# Patient Record
Sex: Female | Born: 1950 | Race: Black or African American | Hispanic: No | Marital: Single | Smoking: Never smoker
Health system: Southern US, Community
[De-identification: ages and names within clinical notes are randomized; demographics above are authoritative.]

## PROBLEM LIST (undated history)

## (undated) DIAGNOSIS — K219 Gastro-esophageal reflux disease without esophagitis: Secondary | ICD-10-CM

## (undated) DIAGNOSIS — M1990 Unspecified osteoarthritis, unspecified site: Secondary | ICD-10-CM

## (undated) HISTORY — PX: NO PAST SURGERIES: SHX2092

---

## 2001-07-13 ENCOUNTER — Encounter: Payer: Self-pay | Admitting: Unknown Physician Specialty

## 2001-07-13 ENCOUNTER — Ambulatory Visit (HOSPITAL_COMMUNITY): Admission: RE | Admit: 2001-07-13 | Discharge: 2001-07-13 | Payer: Self-pay | Admitting: Unknown Physician Specialty

## 2013-05-10 ENCOUNTER — Encounter (HOSPITAL_COMMUNITY): Payer: Self-pay | Admitting: Pharmacy Technician

## 2013-05-13 ENCOUNTER — Other Ambulatory Visit: Payer: Self-pay | Admitting: Orthopedic Surgery

## 2013-05-14 ENCOUNTER — Ambulatory Visit (HOSPITAL_COMMUNITY)
Admission: RE | Admit: 2013-05-14 | Discharge: 2013-05-14 | Disposition: A | Discharge status: Home / Self Care | Payer: BC Managed Care – PPO | Source: Ambulatory Visit | Attending: Orthopedic Surgery | Admitting: Orthopedic Surgery

## 2013-05-14 ENCOUNTER — Encounter (HOSPITAL_COMMUNITY)
Admission: RE | Admit: 2013-05-14 | Discharge: 2013-05-14 | Disposition: A | Discharge status: Home / Self Care | Payer: BC Managed Care – PPO | Source: Ambulatory Visit | Attending: Orthopedic Surgery | Admitting: Orthopedic Surgery

## 2013-05-14 ENCOUNTER — Encounter (HOSPITAL_COMMUNITY): Payer: Self-pay

## 2013-05-14 DIAGNOSIS — Z01818 Encounter for other preprocedural examination: Secondary | ICD-10-CM | POA: Insufficient documentation

## 2013-05-14 DIAGNOSIS — Z01810 Encounter for preprocedural cardiovascular examination: Secondary | ICD-10-CM | POA: Insufficient documentation

## 2013-05-14 DIAGNOSIS — Z01812 Encounter for preprocedural laboratory examination: Secondary | ICD-10-CM | POA: Insufficient documentation

## 2013-05-14 HISTORY — DX: Gastro-esophageal reflux disease without esophagitis: K21.9

## 2013-05-14 HISTORY — DX: Unspecified osteoarthritis, unspecified site: M19.90

## 2013-05-14 LAB — URINALYSIS, ROUTINE W REFLEX MICROSCOPIC
Leukocytes, UA: NEGATIVE
Nitrite: NEGATIVE
Specific Gravity, Urine: 1.026 (ref 1.005–1.030)
Urobilinogen, UA: 1 mg/dL (ref 0.0–1.0)

## 2013-05-14 LAB — CBC
Hemoglobin: 11.8 g/dL — ABNORMAL LOW (ref 12.0–15.0)
MCHC: 31.6 g/dL (ref 30.0–36.0)

## 2013-05-14 LAB — ABO/RH: ABO/RH(D): B POS

## 2013-05-14 LAB — SURGICAL PCR SCREEN
MRSA, PCR: NEGATIVE
Staphylococcus aureus: NEGATIVE

## 2013-05-14 LAB — COMPREHENSIVE METABOLIC PANEL
ALT: 14 U/L (ref 0–35)
Albumin: 3.6 g/dL (ref 3.5–5.2)
Alkaline Phosphatase: 92 U/L (ref 39–117)
GFR calc Af Amer: >90 mL/min (ref >90)
Glucose, Bld: 108 mg/dL — ABNORMAL HIGH (ref 70–99)
Potassium: 4 mEq/L (ref 3.5–5.1)
Sodium: 140 mEq/L (ref 135–145)
Total Protein: 7.5 g/dL (ref 6.0–8.3)

## 2013-05-14 LAB — TYPE AND SCREEN: Antibody Screen: NEGATIVE

## 2013-05-14 NOTE — Pre-Procedure Instructions (Signed)
[name]  05/14/2013   Your procedure is scheduled on: Wednesday, May 22, 2013  Report to [facility] at 10:30 AM.  Call this number if you have problems the morning of surgery: [phone]   Remember:   Do not eat food or drink liquids after midnight.   Take these medicines the morning of surgery with A SIP OF WATER: omeprazole (PRILOSEC) 20 MG capsule  if needed:              traMADol (ULTRAM) 50 MG tablet              Stop taking Aspirin, and herbal medications. Do not take any NSAIDs ie: Ibuprofen, Advil, Naproxen or any medication containing Aspirin (meloxicam (MOBIC) 7.5 MG tablet)             Do not wear jewelry, make-up or nail polish.  Do not wear lotions, powders, or perfumes. You may wear deodorant.  Do not shave 48 hours prior to surgery.   Do not bring valuables to the hospital.  [facility] is not responsible  for any belongings or valuables.  Contacts, dentures or bridgework may not be worn into surgery.  Leave suitcase in the car. After surgery it may be brought to your room.  For patients admitted to the hospital, checkout time is 11:00 AM the day of discharge.   Patients discharged the day of surgery will not be allowed to drive home.  Name and phone number of your driver:   Special Instructions: Shower using CHG 2 nights before surgery and the night before surgery.  If you shower the day of surgery use CHG.  Use special wash - you have one bottle of CHG for all showers.  You should use approximately 1/3 of the bottle for each shower.   Please read over the following fact sheets that you were given: Pain Booklet, Coughing and Deep Breathing, Blood Transfusion Information, MRSA Information and Surgical Site Infection Prevention

## 2013-05-14 NOTE — Progress Notes (Signed)
Dr Lysbeth Galas   In [place].  pcp

## 2013-05-15 NOTE — Progress Notes (Signed)
Anesthesia chart review: Patient is a 62 year old female scheduled for right THA by Dr. Eulah Pont on 05/22/2013. History includes nonsmoker, arthritis, GERD, morbid obesity (BMI 40). PCP is listed as Dr. Joette Catching.  EKG on 05/14/13 showed NSR, poor r wave progression. There are no comparison EKGs in [place], Epic, or at her PCP office.  No CV symptoms were documented at her PAT visit.  She has no known MI/CHF or DM history.  Body habitus and lead placement may be contributing.     CXR on 05/14/13 showed no edema or consolidation.  Preoperative labs noted.  PTT on the day of surgery.  Clinical correlation on the day of surgery.  If she remains asymptomatic from a CV standpoint then I would anticipate that she could proceed as planned.  Velna [facility]chs [facility]/Anesthesiology Phone [phone] 05/15/2013 4:58 PM

## 2013-05-21 MED ORDER — CEFAZOLIN SODIUM-DEXTROSE 2-3 GM-% IV SOLR
2.0000 g | INTRAVENOUS | Status: AC
Start: 2013-05-22 — End: 2013-05-22
  Administered 2013-05-22: 2 g via INTRAVENOUS
  Filled 2013-05-21: qty 50

## 2013-05-21 MED ORDER — TRANEXAMIC ACID 100 MG/ML IV SOLN
1000.0000 mg | INTRAVENOUS | Status: DC
  Filled 2013-05-21: qty 10

## 2013-05-21 NOTE — Progress Notes (Signed)
Pt called with tx change inst to arrive at 0930.message lft on machine.

## 2013-05-21 NOTE — H&P (Signed)
[facility] [address]   [address] [place], [place] [address] [phone] A Division of [facility]  Loreta Ave, M.D.   Robert A. Thurston Hole, M.D.   Burnell Blanks, M.D.   Eulas Post, M.D.   Lunette Stands, M.D Buford Dresser, M.D.  Charlsie Quest, M.D.   Estell Harpin, M.D.   Melina Fiddler, M.D. Genene Churn. Barry Dienes, PA-C            Kirstin A. Shepperson, PA-C Josh Sardis, PA-C [place], [place]   RE: [name], [name]                                [id number]      DOB: 03/16/1951 PROGRESS NOTE: 05-07-13 Chief complaint: Right hip pain.  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  Her symptoms are unchanged from the previous visit and she wishes to proceed with a total hip arthroplasty as scheduled.  She has pain with all activity, particularly rising from a chair.  She has failed conservative treatment, including physical therapy, nonsteroidal anti-inflammatories and intraarticular injection.  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   Current medications: Omeprazole 20 mg q day, Meloxicam 7.5 mg q day and Tramadol 50 mg q day.  She will discontinue the Meloxicam one week prior to the surgery.   Allergies: Shellfish.  No drug allergies. Past medical/surgical history: Significant for slight gastric reflux.  No surgical history.  Two hospitalizations for childbirth, uncomplicated.  Review of systems: Positive for a history of nausea and vomiting when sick with flu like illness, but otherwise unremarkable.  She denies any lightheadedness, dizziness, fevers or chills.  No current cardiac, pulmonary, GI, GU or neuro issues. Family history: Positive for hypertension and diabetes in her father and diabetes in a brother and sister, as well as one child.     Social history: Does not smoke, very occasional glass of wine.  Patient is married.       EXAMINATION: Height: 5?5.  Weight: 247 pounds.  Temperature: 98.9 today in the office.  Blood pressure: 177/92.  Pulse: 95.  O2 SAT: 96%.  She is alert and oriented x 3 and in noa cute distress.  She has a slightly rolling gait.  Head is normocephalic, a traumatic.  PERRLA, EOMI.  Neck is unremarkable, full range of motion.  Lungs: CTA bilaterally.  No wheezes.  Heart: RRR.  No murmurs or rubs.  Abdomen: Soft and non-tender.  NBS x 4.  Right hip has decreased range of motion with loss of almost all rotation to log roll testing.  Left hip has no such difficulties with log roll.  Her pain is significant for both groin and for thigh pain.  Neurovascularly intact distally.  Skin warm and dry.    X-RAYS: X-rays of the hip show no joint space remaining on the left and significant bone destruction in the acetabulum and femoral head.   IMPRESSION: Right hip end stage degenerative joint disease with chronic pain that has failed conservative treatment.  PLAN: Patient will proceed with right total hip replacement as scheduled.  The risks and benefits were discussed.  Possible complications in rehab and recovery.  All questions answered to the patient's satisfaction.  Patient  will need skilled placement and has decided that she prefers to go to [facility] in [place] if at all possible.  Patient will return to see Dr. Eulah Pont postoperatively.    Loreta Ave, M.D.   Electronically verified by Loreta Ave, M.D. DFM(BR):jjh D 05-17-13 T 05-20-13

## 2013-05-22 ENCOUNTER — Inpatient Hospital Stay (HOSPITAL_COMMUNITY): Payer: BC Managed Care – PPO

## 2013-05-22 ENCOUNTER — Inpatient Hospital Stay (HOSPITAL_COMMUNITY): Payer: BC Managed Care – PPO | Admitting: Anesthesiology

## 2013-05-22 ENCOUNTER — Encounter (HOSPITAL_COMMUNITY)
Admission: RE | Disposition: A | Discharge status: Home / Self Care | Payer: Self-pay | Source: Ambulatory Visit | Attending: Orthopedic Surgery

## 2013-05-22 ENCOUNTER — Encounter (HOSPITAL_COMMUNITY): Payer: Self-pay | Admitting: *Deleted

## 2013-05-22 ENCOUNTER — Encounter (HOSPITAL_COMMUNITY): Payer: Self-pay | Admitting: Vascular Surgery

## 2013-05-22 ENCOUNTER — Inpatient Hospital Stay (HOSPITAL_COMMUNITY)
Admission: RE | Admit: 2013-05-22 | Discharge: 2013-05-27 | DRG: 818 | Disposition: A | Discharge status: Home / Self Care | Payer: BC Managed Care – PPO | Source: Ambulatory Visit | Attending: Orthopedic Surgery | Admitting: Orthopedic Surgery

## 2013-05-22 DIAGNOSIS — M1610 Unilateral primary osteoarthritis, unspecified hip: Principal | ICD-10-CM | POA: Diagnosis present

## 2013-05-22 DIAGNOSIS — M169 Osteoarthritis of hip, unspecified: Principal | ICD-10-CM | POA: Diagnosis present

## 2013-05-22 DIAGNOSIS — IMO0002 Reserved for concepts with insufficient information to code with codable children: Secondary | ICD-10-CM | POA: Diagnosis present

## 2013-05-22 DIAGNOSIS — L299 Pruritus, unspecified: Secondary | ICD-10-CM | POA: Diagnosis present

## 2013-05-22 DIAGNOSIS — D62 Acute posthemorrhagic anemia: Secondary | ICD-10-CM | POA: Diagnosis not present

## 2013-05-22 DIAGNOSIS — D500 Iron deficiency anemia secondary to blood loss (chronic): Secondary | ICD-10-CM | POA: Diagnosis present

## 2013-05-22 DIAGNOSIS — Z8249 Family history of ischemic heart disease and other diseases of the circulatory system: Secondary | ICD-10-CM

## 2013-05-22 DIAGNOSIS — M1710 Unilateral primary osteoarthritis, unspecified knee: Secondary | ICD-10-CM | POA: Diagnosis present

## 2013-05-22 DIAGNOSIS — M1611 Unilateral primary osteoarthritis, right hip: Secondary | ICD-10-CM

## 2013-05-22 DIAGNOSIS — Z833 Family history of diabetes mellitus: Secondary | ICD-10-CM

## 2013-05-22 DIAGNOSIS — K219 Gastro-esophageal reflux disease without esophagitis: Secondary | ICD-10-CM | POA: Diagnosis present

## 2013-05-22 HISTORY — PX: TOTAL HIP ARTHROPLASTY: SHX124

## 2013-05-22 LAB — APTT: aPTT: 30 seconds (ref 24–37)

## 2013-05-22 LAB — CREATININE, SERUM: Creatinine, Ser: 0.56 mg/dL (ref 0.50–1.10)

## 2013-05-22 LAB — CBC
MCH: 26.4 pg (ref 26.0–34.0)
MCHC: 31.7 g/dL (ref 30.0–36.0)
Platelets: 246 10*3/uL (ref 150–400)
RBC: 4.2 MIL/uL (ref 3.87–5.11)

## 2013-05-22 SURGERY — ARTHROPLASTY, HIP, TOTAL,POSTERIOR APPROACH
Anesthesia: General | Site: Hip | Laterality: Right | Wound class: Clean

## 2013-05-22 MED ORDER — ACETAMINOPHEN 650 MG RE SUPP: 650.0000 mg | Freq: Four times a day (QID) | RECTAL | Status: DC | PRN

## 2013-05-22 MED ORDER — SODIUM CHLORIDE 0.9 % IJ SOLN
INTRAMUSCULAR | Status: DC | PRN
  Administered 2013-05-22: 13:00:00

## 2013-05-22 MED ORDER — PANTOPRAZOLE SODIUM 40 MG PO TBEC
40.0000 mg | DELAYED_RELEASE_TABLET | Freq: Every day | ORAL | Status: DC
  Administered 2013-05-22 – 2013-05-27 (×6): 40 mg via ORAL
  Filled 2013-05-22 (×6): qty 1

## 2013-05-22 MED ORDER — PHENYLEPHRINE HCL 10 MG/ML IJ SOLN
INTRAMUSCULAR | Status: DC | PRN
  Administered 2013-05-22 (×4): 80 ug via INTRAVENOUS

## 2013-05-22 MED ORDER — HYDROMORPHONE HCL PF 1 MG/ML IJ SOLN
INTRAMUSCULAR | Status: AC
  Filled 2013-05-22: qty 1

## 2013-05-22 MED ORDER — SODIUM CHLORIDE 0.9 % IR SOLN
Status: DC | PRN
  Administered 2013-05-22: 1000 mL

## 2013-05-22 MED ORDER — ONDANSETRON HCL 4 MG/2ML IJ SOLN: 4.0000 mg | Freq: Once | INTRAMUSCULAR | Status: DC | PRN

## 2013-05-22 MED ORDER — PROPOFOL 10 MG/ML IV BOLUS
INTRAVENOUS | Status: DC | PRN
  Administered 2013-05-22: 200 mg via INTRAVENOUS

## 2013-05-22 MED ORDER — GLYCOPYRROLATE 0.2 MG/ML IJ SOLN
INTRAMUSCULAR | Status: DC | PRN
  Administered 2013-05-22: 0.4 mg via INTRAVENOUS

## 2013-05-22 MED ORDER — METOCLOPRAMIDE HCL 5 MG/ML IJ SOLN: 5.0000 mg | Freq: Three times a day (TID) | INTRAMUSCULAR | Status: DC | PRN

## 2013-05-22 MED ORDER — POTASSIUM CHLORIDE IN NACL 20-0.9 MEQ/L-% IV SOLN
INTRAVENOUS | Status: DC
  Administered 2013-05-22 – 2013-05-23 (×3): via INTRAVENOUS
  Filled 2013-05-22 (×10): qty 1000

## 2013-05-22 MED ORDER — ONDANSETRON HCL 4 MG/2ML IJ SOLN: 4.0000 mg | Freq: Four times a day (QID) | INTRAMUSCULAR | Status: DC | PRN

## 2013-05-22 MED ORDER — LACTATED RINGERS IV SOLN
INTRAVENOUS | Status: DC
  Administered 2013-05-22 (×2): via INTRAVENOUS

## 2013-05-22 MED ORDER — NEOSTIGMINE METHYLSULFATE 1 MG/ML IJ SOLN
INTRAMUSCULAR | Status: DC | PRN
  Administered 2013-05-22: 3 mg via INTRAVENOUS

## 2013-05-22 MED ORDER — BUPIVACAINE HCL (PF) 0.25 % IJ SOLN
INTRAMUSCULAR | Status: AC
  Filled 2013-05-22: qty 30

## 2013-05-22 MED ORDER — BUPIVACAINE-EPINEPHRINE PF 0.25-1:200000 % IJ SOLN
INTRAMUSCULAR | Status: DC | PRN
  Administered 2013-05-22: 20 mL

## 2013-05-22 MED ORDER — LIDOCAINE HCL 4 % MT SOLN
OROMUCOSAL | Status: DC | PRN
  Administered 2013-05-22: 4 mL via TOPICAL

## 2013-05-22 MED ORDER — ENOXAPARIN SODIUM 40 MG/0.4ML ~~LOC~~ SOLN
40.0000 mg | SUBCUTANEOUS | Status: DC
  Administered 2013-05-23 – 2013-05-27 (×5): 40 mg via SUBCUTANEOUS
  Filled 2013-05-22 (×7): qty 0.4

## 2013-05-22 MED ORDER — ONDANSETRON HCL 4 MG/2ML IJ SOLN
INTRAMUSCULAR | Status: DC | PRN
  Administered 2013-05-22: 4 mg via INTRAVENOUS

## 2013-05-22 MED ORDER — METOCLOPRAMIDE HCL 10 MG PO TABS: 5.0000 mg | ORAL_TABLET | Freq: Three times a day (TID) | ORAL | Status: DC | PRN

## 2013-05-22 MED ORDER — HYDROMORPHONE HCL PF 1 MG/ML IJ SOLN
1.0000 mg | INTRAMUSCULAR | Status: DC | PRN
  Administered 2013-05-22: 1 mg via INTRAVENOUS
  Filled 2013-05-22: qty 1

## 2013-05-22 MED ORDER — ACETAMINOPHEN 325 MG PO TABS: 650.0000 mg | ORAL_TABLET | Freq: Four times a day (QID) | ORAL | Status: DC | PRN

## 2013-05-22 MED ORDER — BUPIVACAINE LIPOSOME 1.3 % IJ SUSP
20.0000 mL | INTRAMUSCULAR | Status: DC
  Filled 2013-05-22: qty 20

## 2013-05-22 MED ORDER — OXYCODONE-ACETAMINOPHEN 5-325 MG PO TABS
1.0000 | ORAL_TABLET | ORAL | Status: DC | PRN
  Administered 2013-05-22 – 2013-05-25 (×10): 2 via ORAL
  Filled 2013-05-22 (×9): qty 2

## 2013-05-22 MED ORDER — SENNOSIDES-DOCUSATE SODIUM 8.6-50 MG PO TABS: 1.0000 | ORAL_TABLET | Freq: Every evening | ORAL | Status: DC | PRN

## 2013-05-22 MED ORDER — ONDANSETRON HCL 4 MG PO TABS: 4.0000 mg | ORAL_TABLET | Freq: Four times a day (QID) | ORAL | Status: DC | PRN

## 2013-05-22 MED ORDER — HYDROCODONE-ACETAMINOPHEN 5-325 MG PO TABS
1.0000 | ORAL_TABLET | ORAL | Status: DC | PRN
  Administered 2013-05-23 – 2013-05-27 (×4): 2 via ORAL
  Filled 2013-05-22 (×4): qty 2

## 2013-05-22 MED ORDER — ROCURONIUM BROMIDE 100 MG/10ML IV SOLN
INTRAVENOUS | Status: DC | PRN
  Administered 2013-05-22: 30 mg via INTRAVENOUS

## 2013-05-22 MED ORDER — FLEET ENEMA 7-19 GM/118ML RE ENEM: 1.0000 | ENEMA | Freq: Once | RECTAL | Status: AC | PRN

## 2013-05-22 MED ORDER — CELECOXIB 200 MG PO CAPS
200.0000 mg | ORAL_CAPSULE | Freq: Two times a day (BID) | ORAL | Status: DC
  Administered 2013-05-22 – 2013-05-27 (×10): 200 mg via ORAL
  Filled 2013-05-22 (×11): qty 1

## 2013-05-22 MED ORDER — CHLORHEXIDINE GLUCONATE 4 % EX LIQD: 60.0000 mL | Freq: Once | CUTANEOUS | Status: DC

## 2013-05-22 MED ORDER — ENOXAPARIN SODIUM 40 MG/0.4ML ~~LOC~~ SOLN: 40.0000 mg | SUBCUTANEOUS | Status: DC

## 2013-05-22 MED ORDER — FENTANYL CITRATE 0.05 MG/ML IJ SOLN
INTRAMUSCULAR | Status: DC | PRN
  Administered 2013-05-22 (×3): 50 ug via INTRAVENOUS
  Administered 2013-05-22: 100 ug via INTRAVENOUS

## 2013-05-22 MED ORDER — MENTHOL 3 MG MT LOZG: 1.0000 | LOZENGE | OROMUCOSAL | Status: DC | PRN

## 2013-05-22 MED ORDER — LIDOCAINE HCL (CARDIAC) 20 MG/ML IV SOLN
INTRAVENOUS | Status: DC | PRN
  Administered 2013-05-22: 70 mg via INTRAVENOUS

## 2013-05-22 MED ORDER — BISACODYL 10 MG RE SUPP: 10.0000 mg | Freq: Every day | RECTAL | Status: DC | PRN

## 2013-05-22 MED ORDER — OXYCODONE-ACETAMINOPHEN 5-325 MG PO TABS
ORAL_TABLET | ORAL | Status: AC
  Filled 2013-05-22: qty 2

## 2013-05-22 MED ORDER — DOCUSATE SODIUM 100 MG PO CAPS
100.0000 mg | ORAL_CAPSULE | Freq: Two times a day (BID) | ORAL | Status: DC
  Administered 2013-05-22 – 2013-05-27 (×10): 100 mg via ORAL
  Filled 2013-05-22 (×11): qty 1

## 2013-05-22 MED ORDER — CEFAZOLIN SODIUM-DEXTROSE 2-3 GM-% IV SOLR
2.0000 g | Freq: Four times a day (QID) | INTRAVENOUS | Status: AC
  Administered 2013-05-22 – 2013-05-23 (×2): 2 g via INTRAVENOUS
  Filled 2013-05-22 (×2): qty 50

## 2013-05-22 MED ORDER — ALUM & MAG HYDROXIDE-SIMETH 200-200-20 MG/5ML PO SUSP: 30.0000 mL | ORAL | Status: DC | PRN

## 2013-05-22 MED ORDER — HYDROMORPHONE HCL PF 1 MG/ML IJ SOLN
0.2500 mg | INTRAMUSCULAR | Status: DC | PRN
  Administered 2013-05-22 (×6): 0.5 mg via INTRAVENOUS

## 2013-05-22 MED ORDER — PHENOL 1.4 % MT LIQD: 1.0000 | OROMUCOSAL | Status: DC | PRN

## 2013-05-22 SURGICAL SUPPLY — 63 items
BLADE SAW SAG 73X25 THK (BLADE) ×1
BLADE SAW SGTL 73X25 THK (BLADE) ×1 IMPLANT
BOOTCOVER CLEANROOM LRG (PROTECTIVE WEAR) ×2 IMPLANT
BRUSH FEMORAL CANAL (MISCELLANEOUS) IMPLANT
CLOTH BEACON ORANGE TIMEOUT ST (SAFETY) ×2 IMPLANT
COVER BACK TABLE 24X17X13 BIG (DRAPES) IMPLANT
COVER SURGICAL LIGHT HANDLE (MISCELLANEOUS) ×2 IMPLANT
DRAPE INCISE IOBAN 66X45 STRL (DRAPES) ×1 IMPLANT
DRAPE ORTHO SPLIT 77X108 STRL (DRAPES) ×4
DRAPE SURG ORHT 6 SPLT 77X108 (DRAPES) ×2 IMPLANT
DRAPE U-SHAPE 47X51 STRL (DRAPES) ×2 IMPLANT
DRILL BIT 7/64X5 (BIT) ×2 IMPLANT
DRSG PAD ABDOMINAL 8X10 ST (GAUZE/BANDAGES/DRESSINGS) ×4 IMPLANT
DURAPREP 26ML APPLICATOR (WOUND CARE) ×2 IMPLANT
ELECT BLADE 6.5 EXT (BLADE) IMPLANT
ELECT CAUTERY BLADE 6.4 (BLADE) ×2 IMPLANT
ELECT REM PT RETURN 9FT ADLT (ELECTROSURGICAL) ×2
ELECTRODE REM PT RTRN 9FT ADLT (ELECTROSURGICAL) ×1 IMPLANT
EVACUATOR 1/8 PVC DRAIN (DRAIN) IMPLANT
FACESHIELD LNG OPTICON STERILE (SAFETY) ×4 IMPLANT
GAUZE XEROFORM 5X9 LF (GAUZE/BANDAGES/DRESSINGS) ×2 IMPLANT
GLOVE BIOGEL PI IND STRL 8 (GLOVE) ×1 IMPLANT
GLOVE BIOGEL PI INDICATOR 8 (GLOVE) ×1
GLOVE ORTHO TXT STRL SZ7.5 (GLOVE) ×4 IMPLANT
GOWN PREVENTION PLUS XLARGE (GOWN DISPOSABLE) ×4 IMPLANT
GOWN STRL NON-REIN LRG LVL3 (GOWN DISPOSABLE) ×2 IMPLANT
GOWN STRL REIN 2XL XLG LVL4 (GOWN DISPOSABLE) ×2 IMPLANT
HANDPIECE INTERPULSE COAX TIP (DISPOSABLE)
HIP/CERM HD VIT E LINR LEV 1C ×1 IMPLANT
KIT BASIN OR (CUSTOM PROCEDURE TRAY) ×2 IMPLANT
KIT ROOM TURNOVER OR (KITS) ×2 IMPLANT
MANIFOLD NEPTUNE II (INSTRUMENTS) ×2 IMPLANT
NDL HYPO 25GX1X1/2 BEV (NEEDLE) ×1 IMPLANT
NEEDLE HYPO 25GX1X1/2 BEV (NEEDLE) IMPLANT
NS IRRIG 1000ML POUR BTL (IV SOLUTION) ×2 IMPLANT
PACK TOTAL JOINT (CUSTOM PROCEDURE TRAY) ×2 IMPLANT
PAD ARMBOARD 7.5X6 YLW CONV (MISCELLANEOUS) ×4 IMPLANT
PASSER SUT SWANSON 36MM LOOP (INSTRUMENTS) ×2 IMPLANT
PILLOW ABDUCTION HIP (SOFTGOODS) ×2 IMPLANT
PRESSURIZER FEMORAL UNIV (MISCELLANEOUS) IMPLANT
SET HNDPC FAN SPRY TIP SCT (DISPOSABLE) IMPLANT
SPONGE GAUZE 4X4 12PLY (GAUZE/BANDAGES/DRESSINGS) ×2 IMPLANT
SPONGE LAP 4X18 X RAY DECT (DISPOSABLE) ×1 IMPLANT
STAPLER VISISTAT 35W (STAPLE) ×2 IMPLANT
SUCTION FRAZIER TIP 10 FR DISP (SUCTIONS) ×2 IMPLANT
SUT FIBERWIRE #2 38 REV NDL BL (SUTURE) ×4
SUT VIC AB 1 CTX 36 (SUTURE) ×2
SUT VIC AB 1 CTX36XBRD ANBCTR (SUTURE) ×4 IMPLANT
SUT VIC AB 2-0 CT1 27 (SUTURE) ×2
SUT VIC AB 2-0 CT1 TAPERPNT 27 (SUTURE) IMPLANT
SUT VIC AB 2-0 SH 27 (SUTURE) ×2
SUT VIC AB 2-0 SH 27XBRD (SUTURE) ×2 IMPLANT
SUT VIC AB 3-0 SH 27 (SUTURE)
SUT VIC AB 3-0 SH 27X BRD (SUTURE) IMPLANT
SUTURE FIBERWR#2 38 REV NDL BL (SUTURE) ×3 IMPLANT
SYR 30ML SLIP (SYRINGE) ×2 IMPLANT
SYR CONTROL 10ML LL (SYRINGE) ×2 IMPLANT
TAPE CLOTH SURG 6X10 WHT LF (GAUZE/BANDAGES/DRESSINGS) ×1 IMPLANT
TOWEL OR 17X24 6PK STRL BLUE (TOWEL DISPOSABLE) ×2 IMPLANT
TOWEL OR 17X26 10 PK STRL BLUE (TOWEL DISPOSABLE) ×2 IMPLANT
TOWER CARTRIDGE SMART MIX (DISPOSABLE) IMPLANT
TRAY FOLEY CATH 14FR (SET/KITS/TRAYS/PACK) ×2 IMPLANT
WATER STERILE IRR 1000ML POUR (IV SOLUTION) ×6 IMPLANT

## 2013-05-22 NOTE — Transfer of Care (Signed)
Immediate Anesthesia Transfer of Care Note  Patient: [name]  Procedure(s) Performed: Procedure(s): TOTAL HIP ARTHROPLASTY- right  (Right)  Patient Location: PACU  Anesthesia Type:General  Level of Consciousness: awake, alert  and oriented  Airway & Oxygen Therapy: Patient Spontanous Breathing and Patient connected to nasal cannula oxygen  Post-op Assessment: Report given to PACU RN, Post -op Vital signs reviewed and stable and Patient moving all extremities X 4  Post vital signs: Reviewed and stable  Complications: No apparent anesthesia complications

## 2013-05-22 NOTE — Anesthesia Preprocedure Evaluation (Addendum)
Anesthesia Evaluation  Patient identified by MRN, date of birth, ID band Patient awake    Reviewed: Allergy & Precautions, H&P , NPO status , Patient's Chart, lab work & pertinent test results  Airway Mallampati: I TM Distance: >3 FB Neck ROM: full    Dental  (+) Teeth Intact   Pulmonary          Cardiovascular Rhythm:regular Rate:Normal     Neuro/Psych    GI/Hepatic GERD-  ,  Endo/Other    Renal/GU      Musculoskeletal   Abdominal   Peds  Hematology   Anesthesia Other Findings   Reproductive/Obstetrics                          Anesthesia Physical Anesthesia Plan  ASA: I  Anesthesia Plan: General   Post-op Pain Management:    Induction: Intravenous  Airway Management Planned: Oral ETT  Additional Equipment:   Intra-op Plan:   Post-operative Plan: Extubation in OR  Informed Consent: I have reviewed the patients History and Physical, chart, labs and discussed the procedure including the risks, benefits and alternatives for the proposed anesthesia with the patient or authorized representative who has indicated his/her understanding and acceptance.     Plan Discussed with: CRNA, Anesthesiologist and Surgeon  Anesthesia Plan Comments:         Anesthesia Quick Evaluation

## 2013-05-22 NOTE — Interval H&P Note (Signed)
History and Physical Interval Note:  05/22/2013 8:17 AM  [name]  has presented today for surgery, with the diagnosis of DJD RIGHT HIP  The various methods of treatment have been discussed with the patient and family. After consideration of risks, benefits and other options for treatment, the patient has consented to  Procedure(s): TOTAL HIP ARTHROPLASTY (Right) as a surgical intervention .  The patient's history has been reviewed, patient examined, no change in status, stable for surgery.  I have reviewed the patient's chart and labs.  Questions were answered to the patient's satisfaction.     Nareg Breighner F

## 2013-05-22 NOTE — Anesthesia Procedure Notes (Signed)
Procedure Name: Intubation Date/Time: 05/22/2013 11:54 AM Performed by: Quentin Ore Pre-anesthesia Checklist: Patient identified, Emergency Drugs available, Suction available, Patient being monitored and Timeout performed Patient Re-evaluated:Patient Re-evaluated prior to inductionOxygen Delivery Method: Circle system utilized Preoxygenation: Pre-oxygenation with 100% oxygen Intubation Type: IV induction Ventilation: Mask ventilation without difficulty Laryngoscope Size: Mac and 3 Grade View: Grade II Tube type: Oral Tube size: 7.0 mm Number of attempts: 1 Airway Equipment and Method: Stylet and LTA kit utilized Placement Confirmation: ETT inserted through vocal cords under direct vision,  positive ETCO2 and breath sounds checked- equal and bilateral Secured at: 21 cm Tube secured with: Tape Dental Injury: Teeth and Oropharynx as per pre-operative assessment

## 2013-05-22 NOTE — Anesthesia Postprocedure Evaluation (Signed)
  Anesthesia Post-op Note  Patient: [name]  Procedure(s) Performed: Procedure(s): TOTAL HIP ARTHROPLASTY- right  (Right)  Patient Location: PACU  Anesthesia Type:General  Level of Consciousness: awake  Airway and Oxygen Therapy: Patient Spontanous Breathing  Post-op Pain: mild  Post-op Assessment: Post-op Vital signs reviewed  Post-op Vital Signs: stable  Complications: No apparent anesthesia complications

## 2013-05-22 NOTE — Preoperative (Signed)
Beta Blockers   Reason not to administer Beta Blockers:Not Applicable 

## 2013-05-23 ENCOUNTER — Encounter (HOSPITAL_COMMUNITY): Payer: Self-pay | Admitting: Orthopedic Surgery

## 2013-05-23 LAB — BASIC METABOLIC PANEL
Calcium: 8.4 mg/dL (ref 8.4–10.5)
Creatinine, Ser: 0.72 mg/dL (ref 0.50–1.10)
GFR calc Af Amer: >90 mL/min (ref >90)

## 2013-05-23 NOTE — Clinical Social Work Placement (Addendum)
Clinical Social Work Department  CLINICAL SOCIAL WORK PLACEMENT NOTE  05/23/2013  Patient: LILIE VEZINA Account Number: [id number]  Admit date: 05/22/13  Clinical Social Worker: Sabino Niemann MSW Date/time: 05/23/2013 1:00 PM  Clinical Social Work is seeking post-discharge placement for this patient at the following level of care: SKILLED NURSING (*CSW will update this form in Epic as items are completed)  05/23/2013 Patient/family provided with [facility] Department of Clinical Social Work's list of facilities offering this level of care within the geographic area requested by the patient (or if unable, by the patient's family).  05/23/2013 Patient/family informed of their freedom to choose among providers that offer the needed level of care, that participate in Medicare, Medicaid or managed care program needed by the patient, have an available bed and are willing to accept the patient.  6/19/2014Patient/family informed of [facility]' ownership interest in [facility], as well as of the fact that they are under no obligation to receive care at this facility.  PASARR submitted to EDS on  PASARR number received from EDS on  FL2 transmitted to all facilities in geographic area requested by pt/family on 05/23/2013  FL2 transmitted to all facilities within larger geographic area on  Patient informed that his/her managed care company has contracts with or will negotiate with certain facilities, including the following:  Patient/family informed of bed offers received:  Patient chooses bed at [facility] in [place]  Physician recommends and patient chooses bed at  Patient to be transferred to on  Patient to be transferred to facility by  The following physician request were entered in Epic:  Additional Comments:

## 2013-05-23 NOTE — Clinical Social Work Psychosocial (Signed)
Clinical Social Work Department  BRIEF PSYCHOSOCIAL ASSESSMENT  Patient:Taleyah B Miers Account Number: [id number]  Admit date: 05/22/13 Clinical Social Worker Sabino Niemann, MSW Date/Time: 05/23/2013 1:30 PM Referred by: Physician Date Referred: 05/23/2013 Referred for   SNF Placement   Other Referral:  Interview type: Patient-Patient's family was present Other interview type: PSYCHOSOCIAL DATA  Living Status:Husband Admitted from facility:  Level of care:  Primary support name: Hypes,David Primary support relationship to patient: Spouse Degree of support available:  Strong and vested  CURRENT CONCERNS  Current Concerns   Post-Acute Placement   Other Concerns:  SOCIAL WORK ASSESSMENT / PLAN  CSW met with pt re: PT recommendation for SNF.   Pt lives with her husband  CSW explained placement process and answered questions.   Pt reports [facility] in [place] as her preference    CSW completed FL2 and initiated SNF search.     Assessment/plan status: Information/Referral to [facility]  Other assessment/ plan:  Information/referral to community resources:  SNF   PTAR  PATIENT'S/FAMILY'S RESPONSE TO PLAN OF CARE:  Pt  reports she is agreeable to ST SNF in order to increase strength and independence with mobility prior to returning home  Pt verbalized understanding of placement process and appreciation for CSW assist.   Sabino Niemann, MSW [phone]

## 2013-05-23 NOTE — Evaluation (Signed)
Occupational Therapy Evaluation Patient Details Name: [name] MRN: [id number] DOB: Oct 20, 1951 Today's Date: 05/23/2013 Time: 4098-1191 OT Time Calculation (min): 31 min  OT Assessment / Plan / Recommendation Clinical Impression  Pt is a 62 yr old female admitted for elective right THA.  Pt currently total assist +2 (pt 50%) for LB selfcare and functional transfers.  Feel pt will benefit from acute care OT to help increase overall independence with basic selfcare tasks but anticipate pt will need follow-up SNF rehab at discharge.    OT Assessment  Patient needs continued OT Services    Follow Up Recommendations  SNF    Barriers to Discharge Decreased caregiver support    Equipment Recommendations  Tub/shower bench       Frequency  Min 2X/week    Precautions / Restrictions Precautions Precautions: Posterior Hip;Fall Precaution Comments: pt educated on 3/3 hip precautions; at end of session pt able to recall 2/3 independently  Restrictions Weight Bearing Restrictions: No RLE Weight Bearing: Weight bearing as tolerated   Pertinent Vitals/Pain Pain reported at 5/10 in the right hip, ice applied and meds given earlier    ADL  Eating/Feeding: Independent Where Assessed - Eating/Feeding: Chair Grooming: Simulated;Set up Where Assessed - Grooming: Supported sitting Upper Body Bathing: Simulated;Set up Where Assessed - Upper Body Bathing: Supported sitting Lower Body Bathing: Simulated;+2 Total assistance Lower Body Bathing: Patient Percentage: 50% Where Assessed - Lower Body Bathing: Supported sit to stand Upper Body Dressing: Simulated;Set up Where Assessed - Upper Body Dressing: Unsupported sitting Lower Body Dressing: Simulated;+2 Total assistance Lower Body Dressing: Patient Percentage: 50% Where Assessed - Lower Body Dressing: Supported sit to stand Toilet Transfer: Performed;+2 Total assistance Toilet Transfer: Patient Percentage: 50% Statistician Method:  Surveyor, minerals: Materials engineer and Hygiene: Simulated;+2 Total assistance Toileting - Architect and Hygiene: Patient Percentage: 50% Where Assessed - Engineer, mining and Hygiene: Sit to stand from 3-in-1 or toilet Tub/Shower Transfer Method: Not assessed Equipment Used: Gait belt;Rolling walker Transfers/Ambulation Related to ADLs: Pt is able to take 5-6 small steps with total assist +2 (pt 50%) using the RW. ADL Comments: Pt is overall total assist +2 for LB selfcare tasks.  Will benefit from AE for LB selfcare.  Plans to discharge to SNF level rehab.      OT Diagnosis: Acute pain  OT Problem List: Decreased strength;Decreased activity tolerance;Impaired balance (sitting and/or standing);Pain;Decreased knowledge of precautions OT Treatment Interventions: Self-care/ADL training;DME and/or AE instruction;Therapeutic activities;Balance training;Patient/family education   OT Goals Acute Rehab OT Goals OT Goal Formulation: With patient/family Time For Goal Achievement: 05/30/13 Potential to Achieve Goals: Good ADL Goals Pt Will Perform Grooming: with min assist;Supported;Standing at sink ADL Goal: Grooming - Progress: Goal set today Pt Will Perform Lower Body Bathing: with min assist;Sit to stand from bed;Supported ADL Goal: Lower Body Bathing - Progress: Goal set today Pt Will Perform Lower Body Dressing: with min assist;with adaptive equipment;Sit to stand from bed ADL Goal: Lower Body Dressing - Progress: Goal set today Pt Will Transfer to Toilet: with min assist;Ambulation;3-in-1;with DME;Maintaining hip precautions ADL Goal: Toilet Transfer - Progress: Goal set today Pt Will Perform Toileting - Clothing Manipulation: with min assist ADL Goal: Toileting - Clothing Manipulation - Progress: Goal set today Pt Will Perform Toileting - Hygiene: with min assist;Sit to stand from 3-in-1/toilet ADL Goal:  Toileting - Hygiene - Progress: Goal set today Miscellaneous OT Goals Miscellaneous OT Goal #1: Pt will state and follow 3/3  THR precautions.  OT Goal: Miscellaneous Goal #1 - Progress: Goal set today  Visit Information  Last OT Received On: 05/23/13 Assistance Needed: +2    Subjective Data  Subjective: We're going to get up to the chair? Patient Stated Goal: Get back to moving better.   Prior Functioning     Home Living Lives With: Spouse;Other (Comment) Available Help at Discharge: Skilled Nursing Facility;Other (Comment) (Going to the [facility]) Type of Home: House Home Access: Stairs to enter [employer] of Steps: 6 Entrance Stairs-Rails: Right;Left Bathroom Shower/Tub: Forensic scientist: Standard Home Adaptive Equipment: Straight cane;Bedside commode/3-in-1;Hand-held shower hose Prior Function Level of Independence: Independent with assistive device(s) Able to Take Stairs?: Yes Driving: Yes Vocation: Other (comment) Comments: substitute teacher Dominant Hand: Right         Vision/Perception Vision - History Baseline Vision: No visual deficits Patient Visual Report: No change from baseline Vision - Assessment Vision Assessment: Vision not tested Perception Perception: Within Functional Limits Praxis Praxis: Intact   Cognition  Cognition Arousal/Alertness: Awake/alert Behavior During Therapy: WFL for tasks assessed/performed Overall Cognitive Status: Within Functional Limits for tasks assessed    Extremity/Trunk Assessment Right Upper Extremity Assessment RUE ROM/Strength/Tone: Within functional levels RUE Sensation: WFL - Light Touch RUE Coordination: WFL - gross/fine motor Left Upper Extremity Assessment LUE ROM/Strength/Tone: Within functional levels LUE Sensation: WFL - Light Touch LUE Coordination: WFL - gross/fine motor Right Lower Extremity Assessment RLE ROM/Strength/Tone: Deficits;Unable to fully  assess;Due to pain;Due to precautions RLE ROM/Strength/Tone Deficits: DF/PF WFL; unable to assess knee or hip due to pain  RLE Sensation: WFL - Light Touch Left Lower Extremity Assessment LLE ROM/Strength/Tone: WFL for tasks assessed LLE Sensation: WFL - Light Touch Trunk Assessment Trunk Assessment: Normal     Mobility Bed Mobility Bed Mobility: Supine to Sit Supine to Sit: 3: Mod assist;HOB elevated;With rails Details for Bed Mobility Assistance: Pt transitioned supine to sit with mod facilitation in the trunk to transition to sitting.   Transfers Transfers: Sit to Stand Sit to Stand: 1: +2 Total assist;From bed;With upper extremity assist;From elevated surface Sit to Stand: Patient Percentage: 50% Stand to Sit: 1: +2 Total assist;To chair/3-in-1;With armrests;With upper extremity assist Stand to Sit: Patient Percentage: 50% Details for Transfer Assistance: pt required max encouragement and 2+ (A) to come to upright standing position due to pain in R hip; mod cues for hand placement and safety         Balance Balance Balance Assessed: Yes Static Sitting Balance Static Sitting - Balance Support: Bilateral upper extremity supported;Feet supported Static Sitting - Level of Assistance: 5: Stand by assistance Static Standing Balance Static Standing - Balance Support: Right upper extremity supported;Left upper extremity supported Static Standing - Level of Assistance: 1: +2 Total assist;Patient percentage (comment);Other (comment) (pt 50%)   End of Session OT - End of Session Equipment Utilized During Treatment: Gait belt Activity Tolerance: Patient limited by pain Patient left: in chair;with call bell/phone within reach;with family/visitor present Nurse Communication: Mobility status      Sayaka Hoeppner OTR/L Pager number [phone] 05/23/2013, 4:14 PM

## 2013-05-23 NOTE — Evaluation (Signed)
Physical Therapy Evaluation Patient Details Name: [name] MRN: [id number] DOB: 09/28/1951 Today's Date: 05/23/2013 Time: 1340-1410 PT Time Calculation (min): 30 min  PT Assessment / Plan / Recommendation Clinical Impression  Pt is a 62 y.o. female s/p R THA POD#1. Pt presents with mobility deficits, increased pain in R hip and decreased strength/ROM in R hip. WOuld benefit from skilled PT to maximize functional mobility and return to PLOF. Pt plans to D/C to SNF at [facility] center for continued rehab when medically ready.     PT Assessment  Patient needs continued PT services    Follow Up Recommendations  SNF    Does the patient have the potential to tolerate intense rehabilitation      Barriers to Discharge        Equipment Recommendations  None recommended by PT    Recommendations for Other Services     Frequency 7X/week    Precautions / Restrictions Precautions Precautions: Posterior Hip;Fall Precaution Comments: pt educated on 3/3 hip precautions; at end of session pt able to recall 2/3 independently  Restrictions Weight Bearing Restrictions: Yes RLE Weight Bearing: Weight bearing as tolerated   Pertinent Vitals/Pain Pt was premedicated. 5/10 pain prior to therapy; 0/10 at rest at end of session; c/o dizziness with activity.  100% O2 on RA      Mobility  Bed Mobility Bed Mobility: Supine to Sit Supine to Sit: 3: Mod assist;HOB elevated;With rails Details for Bed Mobility Assistance: (A) to advance trunk to upright sitting position on EOB; required increased time and facilitation to initiate movement of R LE to EOB; verbal cues for hand placement and sequencing  Transfers Transfers: Sit to Stand;Stand to Sit Sit to Stand: 1: +2 Total assist;From bed;With upper extremity assist;From elevated surface Sit to Stand: Patient Percentage: 50% Stand to Sit: 1: +2 Total assist;To chair/3-in-1;With armrests;With upper extremity assist Stand to Sit: Patient Percentage:  50% Details for Transfer Assistance: pt required max encouragement and 2+ (A) to come to upright standing position due to pain in R hip; mod cues for hand placement and safety  Ambulation/Gait Ambulation/Gait Assistance: 1: +2 Total assist Ambulation/Gait: Patient Percentage: 50% Ambulation Distance (Feet): 2 Feet Assistive device: Rolling walker Ambulation/Gait Assistance Details: requires (A) to facilitate and advance R LE; pt has difficulty initating movement in R LE; mod cues for gt sequencing  Gait Pattern: Step-to pattern;Decreased stance time - right;Decreased step length - left;Trunk flexed Gait velocity: decreased due to pain  General Gait Details: pt c/o dizziniess and required return to sitting in chair  Stairs: No Wheelchair Mobility Wheelchair Mobility: No    Exercises Total Joint Exercises Ankle Circles/Pumps: AROM;Both;10 reps;Supine Gluteal Sets: AROM;10 reps;Seated   PT Diagnosis: Difficulty walking;Acute pain  PT Problem List: Decreased strength;Decreased range of motion;Decreased activity tolerance;Decreased balance;Decreased mobility;Decreased knowledge of use of DME;Pain;Decreased knowledge of precautions PT Treatment Interventions: DME instruction;Gait training;Stair training;Functional mobility training;Therapeutic activities;Therapeutic exercise;Balance training;Neuromuscular re-education;Patient/family education   PT Goals Acute Rehab PT Goals PT Goal Formulation: With patient Time For Goal Achievement: 05/30/13 Potential to Achieve Goals: Good Pt will go Supine/Side to Sit: with modified independence PT Goal: Supine/Side to Sit - Progress: Goal set today Pt will go Sit to Supine/Side: with modified independence PT Goal: Sit to Supine/Side - Progress: Goal set today Pt will go Sit to Stand: with supervision PT Goal: Sit to Stand - Progress: Goal set today Pt will go Stand to Sit: with supervision PT Goal: Stand to Sit - Progress: Goal set today Pt  will  Ambulate: >150 feet;with supervision;with rolling walker PT Goal: Ambulate - Progress: Goal set today Pt will Go Up / Down Stairs: 6-9 stairs;with supervision;with rail(s);with least restrictive assistive device PT Goal: Up/Down Stairs - Progress: Goal set today Pt will Perform Home Exercise Program: Independently PT Goal: Perform Home Exercise Program - Progress: Goal set today Additional Goals Additional Goal #1: Pt to be able to independently recall 3/3 hip precautions  PT Goal: Additional Goal #1 - Progress: Goal set today  Visit Information  Last PT Received On: 05/23/13 Assistance Needed: +2 PT/OT Co-Evaluation/Treatment: Yes    Subjective Data  Subjective: Pt lying supine with husband present; agreeable to therapy  Patient Stated Goal: [facility] then home   Prior Functioning  Home Living Lives With: Spouse;Other (Comment) Available Help at Discharge: Skilled Nursing Facility;Other (Comment) (Going to the [facility]) Type of Home: House Home Access: Stairs to enter [employer] of Steps: 6 Entrance Stairs-Rails: Right;Left Bathroom Shower/Tub: Forensic scientist: Standard Home Adaptive Equipment: Straight cane;Bedside commode/3-in-1;Hand-held shower hose Prior Function Level of Independence: Independent with assistive device(s) Able to Take Stairs?: Yes Driving: Yes Vocation: Other (comment) Comments: substitute teacher Dominant Hand: Right    Cognition  Cognition Arousal/Alertness: Awake/alert Behavior During Therapy: WFL for tasks assessed/performed Overall Cognitive Status: Within Functional Limits for tasks assessed    Extremity/Trunk Assessment Right Lower Extremity Assessment RLE ROM/Strength/Tone: Deficits;Unable to fully assess;Due to pain;Due to precautions RLE ROM/Strength/Tone Deficits: DF/PF WFL; unable to assess knee or hip due to pain  RLE Sensation: WFL - Light Touch Left Lower Extremity Assessment LLE  ROM/Strength/Tone: WFL for tasks assessed LLE Sensation: WFL - Light Touch Trunk Assessment Trunk Assessment: Kyphotic   Balance Balance Balance Assessed: Yes Static Sitting Balance Static Sitting - Balance Support: Bilateral upper extremity supported;Feet supported Static Sitting - Level of Assistance: 5: Stand by assistance  End of Session PT - End of Session Equipment Utilized During Treatment: Gait belt Activity Tolerance: Patient limited by fatigue;Other (comment) (c/o dizziniess with amb) Patient left: in chair;with call bell/phone within reach;with family/visitor present Nurse Communication: Mobility status  GP     Donell Sievert , [place] [phone]  05/23/2013, 3:52 PM

## 2013-05-23 NOTE — Op Note (Signed)
NAMEALEXISMARIE, FLAIM NO.:  [id number]  MEDICAL RECORD NO.:  [id number]  LOCATION:  5N27C                        FACILITY:  [facility]  PHYSICIAN:  Loreta Ave, M.D. 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  A press-fit 50-mm acetabular component screw fixation x2. A 36-mm internal diameter liner with a 10-degree overhang.  A press-fit #8 secure fit stem.  A 36+ 0 biologic head.  SURGEON:  Loreta Ave, M.D.  ASSISTED:  Skip Mayer, PA, present throughout the entire case and necessary for timely completion of procedure.  ANESTHESIA:  General.  ESTIMATED BLOOD LOSS:  400 mL.  BLOOD GIVEN:  None.  SPECIMENS:  None.  CULTURES:  None.  COMPLICATION:  None.  DRESSINGS:  Soft compressive abduction pillow.  PROCEDURE IN DETAIL:  The patient was brought to operating room, placed on the operating table in supine position.  After adequate anesthesia had been obtained, turned to a lateral position with appropriate padding and support.  All preparation and everything involved in the procedure was more difficult because of the morbid obesity.  After being prepped and draped, an incision along the shaft of femur extending posterosuperior.  Skin and subcutaneous tissue and generous fat layer was incised.  Iliotibial band was incised.  Charnley retractor was put in place.  Neurovascular structures was protected.  External rotator and capsule were taken down off the back of the intertrochanteric groove of the femur.  Hip was dislocated.   Femoral neck cut 1 fingerbreadth above the lesser trochanter.  With considerable difficulty because of obesity acetabulum was exposed.  Redundant tissue debris cleared throughout. The acetabulum was brought up to good bleeding bone.  Sufficient medial and inferior placement.  Fitted with a 50 mm cup press-fit at 45 degrees of abduction, 20 degrees of anteversion.  Good capturing of fixation. Augmented with 2 screws through the cup.  A 36-mm internal diameter polyethylene liner with a 10-degree overhang place posterosuperior. Femur was exposed.  Distal reamers and broaches brought up to good fitting with a #8 component.  Restoring good anteversion.  After appropriate trials, a #8 component was seated.  A 127-degree neck angle, 30 mm neck.  With the 36+ 0 head attached.  Hip was reduced.  Excellent leg lengths.  Excellent stability in flexion and extension.  Copious irrigation with a pulse irrigating device.  External rotator and capsule repaired to the back of the intertrochanteric groove through drill holes, and tied over bony bridge.  Wound was irrigated.  Soft tissues injected with Exparel.  Iliotibial band closed with #1 Vicryl.  Skin and subcutaneous tissue with Vicryl and staples.  Margins were injected with Marcaine.  Sterile compressive dressing was applied.  Returned to supine position.  Anesthesia reversed.  Brought to the recovery room. Tolerated surgery well.  No complications.     Reuel Boom  Georg Ruddle, M.D.     DFM/MEDQ  D:  05/22/2013  T:  05/22/2013  Job:  [id number]

## 2013-05-23 NOTE — Progress Notes (Signed)
Subjective: 1 Day Post-Op Procedure(s) (LRB): TOTAL HIP ARTHROPLASTY- right  (Right) Patient reports pain as 3 on 0-10 scale. No nausea or vomiting  Objective: Vital signs in last 24 hours: Temp:  [97.1 F (36.2 C)-98 F (36.7 C)] 97.1 F (36.2 C) (06/19 0650) Pulse Rate:  [50-70] 50 (06/19 0650) Resp:  [16-25] 18 (06/19 0650) BP: (150-195)/(70-102) 155/78 mmHg (06/19 0650) SpO2:  [99 %-100 %] 100 % (06/19 0650)  Intake/Output from previous day: 06/18 0701 - 06/19 0700 In: 2340 [P.O.:240; I.V.:2100] Out: 1800 [Urine:1400; Blood:400] Intake/Output this shift: Total I/O In: 1140 [P.O.:240; I.V.:900] Out: 600 [Urine:600]   Recent Labs  05/22/13 1744 05/23/13 0425  HGB 11.1* 9.0*    Recent Labs  05/22/13 1744 05/23/13 0425  WBC 15.8* 7.9  RBC 4.20 3.48*  HCT 35.0* 29.1*  PLT 246 241    Recent Labs  05/22/13 1744 05/23/13 0425  NA  --  141  K  --  3.5  CL  --  105  CO2  --  27  BUN  --  13  CREATININE 0.56 0.72  GLUCOSE  --  143*  CALCIUM  --  8.4   No results found for this basename: LABPT, INR,  in the last 72 hours  ABD soft Neurovascular intact Dorsiflexion/Plantar flexion intact Dressing clean and dry  Assessment/Plan: 1 Day Post-Op Procedure(s) (LRB): TOTAL HIP ARTHROPLASTY- right  (Right) Advance diet Up with therapy D/C IV fluids Discharge to SNF in 1-2 days ;patient prefers [facility] in [place] B 05/23/2013, 6:58 AM [phone]

## 2013-05-23 NOTE — Progress Notes (Signed)
UR COMPLETED  

## 2013-05-24 DIAGNOSIS — M1611 Unilateral primary osteoarthritis, right hip: Secondary | ICD-10-CM | POA: Diagnosis present

## 2013-05-24 LAB — CBC
HCT: 26.7 % — ABNORMAL LOW (ref 36.0–46.0)
MCH: 25.9 pg — ABNORMAL LOW (ref 26.0–34.0)
MCV: 83.6 fL (ref 78.0–100.0)
MCV: 83.7 fL (ref 78.0–100.0)
Platelets: 241 10*3/uL (ref 150–400)
Platelets: 204 10*3/uL (ref 150–400)
RBC: 3.19 MIL/uL — ABNORMAL LOW (ref 3.87–5.11)
RDW: 14 % (ref 11.5–15.5)
WBC: 7.9 10*3/uL (ref 4.0–10.5)
WBC: 11.4 10*3/uL — ABNORMAL HIGH (ref 4.0–10.5)

## 2013-05-24 NOTE — Discharge Summary (Signed)
Physician Discharge Summary  Patient ID: [name] MRN: [id number] DOB/AGE: December 12, 1950 62 y.o.  Admit date: 05/22/2013 Discharge date: 05/24/2013  Admission Diagnoses:  Discharge Diagnoses:  Active Problems:   Osteoarthritis of right hip   Discharged Condition: stable  Hospital Course: Patient underwent Right total hip arthroplasty on date of admission. Post operative course uneventful, including post op PT/OT, dvt prophylaxis,  Pain medication. She will be transferring to [facility] on date of discharge to continue her convalescence and PT.  Consults: None  Significant Diagnostic Studies: post operative monitoring labs and xray unremarkable  Treatments: antibiotics: Ancef, therapies: PT and OT and surgery: right total hip  Discharge Exam: Blood pressure 136/62, pulse 79, temperature 98.5 F (36.9 C), temperature source Oral, resp. rate 18, SpO2 96.00%. Incision/Wound: clean and dry without sign of infection  Disposition: Final discharge disposition not confirmed     Medication List    ASK your doctor about these medications       meloxicam 7.5 MG tablet  Commonly known as:  MOBIC  Take 7.5 mg by mouth daily.     omeprazole 20 MG capsule  Commonly known as:  PRILOSEC  Take 20 mg by mouth daily.     traMADol 50 MG tablet  Commonly known as:  ULTRAM  Take 50-100 mg by mouth every 6 (six) hours as needed for pain.         SignedClarene Gay 05/24/2013, 4:40 PM  Addendum 05/27/2013 Patient was unable to be approved by insurance for rehab bed in a timely fashion, so her transfer was held, and she progressed in inpatient rehab well enough for safe transfer to home in the care of her family. Her hospital stay for the additional days was unremarkable other than asymptomatic post blood loss anemia and itching without rash due to percocet. She will follow up in the office on or about June 05, 2013. Her discharge medications were amended appropriately.

## 2013-05-24 NOTE — Progress Notes (Addendum)
Still awaiting [employer] authorization. [facility] reported that they may get the authorization this weekend.    Sabino Niemann, MSW,  [phone]

## 2013-05-24 NOTE — Progress Notes (Signed)
Physical Therapy Treatment Patient Details Name: [name] MRN: [id number] DOB: [birth year] Today's Date: 05/24/2013 Time: 0865-7846 PT Time Calculation (min): 24 min  PT Assessment / Plan / Recommendation Comments on Treatment Session  Patient progressing well. Still having some trouble to standing. Able to increase ambulation but limtied by fatique. Continue with current POC while patient awaits bed at [facility] in [place] Continue to recommend SNF for ongoing Physical Therapy.       Follow Up Recommendations  SNF     Does the patient have the potential to tolerate intense rehabilitation     Barriers to Discharge        Equipment Recommendations  None recommended by PT    Recommendations for Other Services    Frequency 7X/week   Plan Discharge plan remains appropriate;Frequency remains appropriate    Precautions / Restrictions Precautions Precautions: Posterior Hip;Fall Precaution Comments: pt educated on 3/3 hip precautions; at end of session pt able to recall 2/3 independently  Restrictions Weight Bearing Restrictions: Yes RLE Weight Bearing: Weight bearing as tolerated   Pertinent Vitals/Pain     Mobility  Bed Mobility Supine to Sit: 3: Mod assist;HOB elevated;With rails Details for Bed Mobility Assistance: Pt transitioned supine to sit with mod facilitation in the trunk to transition to sitting.  A for sequency of LEs Transfers Sit to Stand: 1: +2 Total assist;From bed;With upper extremity assist;From elevated surface Sit to Stand: Patient Percentage: 70% Stand to Sit: 4: Min assist;With armrests;With upper extremity assist;To chair/3-in-1 Details for Transfer Assistance: A to control descent onto recliner and to initiate stand and R LE placement. Cues for hand placement and technique Ambulation/Gait Ambulation/Gait Assistance: 4: Min assist Ambulation Distance (Feet): 30 Feet Assistive device: Rolling walker Ambulation/Gait Assistance Details: A to advance  R LE. Cues for RW managment and gait sequence Gait Pattern: Step-to pattern;Decreased stance time - right;Decreased step length - left;Trunk flexed Gait velocity: decreased    Exercises Total Joint Exercises Ankle Circles/Pumps: AROM;Both;10 reps;Supine Gluteal Sets: AROM;10 reps;Seated Heel Slides: AAROM;Right;10 reps Hip ABduction/ADduction: AAROM;Right;10 reps   PT Diagnosis:    PT Problem List:   PT Treatment Interventions:     PT Goals Acute Rehab PT Goals PT Goal: Supine/Side to Sit - Progress: Progressing toward goal PT Goal: Sit to Stand - Progress: Progressing toward goal PT Goal: Stand to Sit - Progress: Progressing toward goal PT Goal: Ambulate - Progress: Progressing toward goal PT Goal: Perform Home Exercise Program - Progress: Progressing toward goal Additional Goals PT Goal: Additional Goal #1 - Progress: Progressing toward goal  Visit Information  Last PT Received On: 05/24/13 Assistance Needed: +1    Subjective Data      Cognition  Cognition Arousal/Alertness: Awake/alert Behavior During Therapy: WFL for tasks assessed/performed Overall Cognitive Status: Within Functional Limits for tasks assessed    Balance     End of Session PT - End of Session Equipment Utilized During Treatment: Gait belt Activity Tolerance: Patient limited by fatigue;Other (comment);Patient tolerated treatment well Patient left: in chair;with call bell/phone within reach;with family/visitor present Nurse Communication: Mobility status   GP     [name] 05/24/2013, 10:44 AM  05/24/2013 [name] PTA [phone] pager [phone] office

## 2013-05-24 NOTE — Progress Notes (Signed)
Subjective: 2 Days Post-Op Procedure(s) (LRB): TOTAL HIP ARTHROPLASTY- right  (Right) Patient reports pain as 4 on 0-10 scale.    Objective: Vital signs in last 24 hours: Temp:  [98.7 F (37.1 C)-99 F (37.2 C)] 98.7 F (37.1 C) (06/20 0608) Pulse Rate:  [71-89] 82 (06/20 0608) Resp:  [16] 16 (06/20 0608) BP: (128-145)/(63-68) 142/68 mmHg (06/20 0608) SpO2:  [95 %-99 %] 95 % (06/20 0608)  Intake/Output from previous day: 06/19 0701 - 06/20 0700 In: -  Out: 600 [Urine:600] Intake/Output this shift:     Recent Labs  05/22/13 1744 05/23/13 0425 05/24/13 0438  HGB 11.1* 9.0* 8.3*    Recent Labs  05/23/13 0425 05/24/13 0438  WBC 7.9 11.4*  RBC 3.48* 3.19*  HCT 29.1* 26.7*  PLT 241 204    Recent Labs  05/22/13 1744 05/23/13 0425  NA  --  141  K  --  3.5  CL  --  105  CO2  --  27  BUN  --  13  CREATININE 0.56 0.72  GLUCOSE  --  143*  CALCIUM  --  8.4   No results found for this basename: LABPT, INR,  in the last 72 hours  ABD soft Neurovascular intact Incision: dressing C/D/I and scant drainage Compartment soft  Assessment/Plan: 2 Days Post-Op Procedure(s) (LRB): TOTAL HIP ARTHROPLASTY- right  (Right) Up with therapy Discharge to SNF when bed available, hopefully in am  Sheridan Hew B 05/24/2013, 9:04 AM

## 2013-05-25 LAB — CBC
HCT: 26.7 % — ABNORMAL LOW (ref 36.0–46.0)
Hemoglobin: 8.4 g/dL — ABNORMAL LOW (ref 12.0–15.0)
MCH: 26.6 pg (ref 26.0–34.0)
MCHC: 31.5 g/dL (ref 30.0–36.0)
RBC: 3.16 MIL/uL — ABNORMAL LOW (ref 3.87–5.11)

## 2013-05-25 NOTE — Progress Notes (Signed)
Seen in Room 5N27  Afebrile vital signs stable  S:  May want to go home before she gets to SNF.  Did well this am, up to bathroom etc.  Hasw not yet been on stairs  O: Sitting comfortabley in chair  A: may be ready for discharge home tomorrow or the next day  P: Her husband was with her and he was reassured that if she goes home that she will get home therapy.

## 2013-05-25 NOTE — Progress Notes (Signed)
Physical Therapy Treatment Patient Details Name: [name] MRN: [id number] DOB: July 27, 1951 Today's Date: 05/25/2013 Time: 1478-2956 PT Time Calculation (min): 30 min  PT Assessment / Plan / Recommendation Comments on Treatment Session  POD # 3 R THR posterior approach progressing slowly.  Assisted out of recliner to amb to bathroom 11 feet took nearly 8 min to perform.  Great difficulty advancing R LE. Demon decreased safety cognition and delayed responces.  HIGH FALL RISK.  Pt will need st Rehab @ SNF.    Follow Up Recommendations  SNF     Does the patient have the potential to tolerate intense rehabilitation     Barriers to Discharge        Equipment Recommendations  None recommended by PT    Recommendations for Other Services    Frequency 7X/week   Plan Discharge plan remains appropriate;Frequency remains appropriate    Precautions / Restrictions Precautions Precautions: Posterior Hip;Fall Precaution Comments: Pt recalls 2/3 THP so re educated  Restrictions Weight Bearing Restrictions: No RLE Weight Bearing: Weight bearing as tolerated   Pertinent Vitals/Pain C/o MAX faitgue and 7/10 R hip pain with TE's    Mobility  Bed Mobility Bed Mobility: Sit to Supine Sit to Supine: 2: Max assist Details for Bed Mobility Assistance: Max assist for B LE's and scooting to [facility] with increased time.  Use of rails. Transfers Transfers: Sit to Stand;Stand to Sit Sit to Stand: 1: +2 Total assist;With upper extremity assist;From chair/3-in-1;From toilet Sit to Stand: Patient Percentage: 60% Stand to Sit: 3: Mod assist;To toilet;To bed Details for Transfer Assistance: 75% VC's on proper tech, hand placement and R LE advancement.  Pt very slow, groggy and required repeat cueing to stay on task.  Ambulation/Gait Ambulation/Gait Assistance: 4: Min assist Ambulation Distance (Feet): 22 Feet Ambulation/Gait Assistance Details: Very slow sluggish gait with difficulty advancing R LE.   Pt reruired 50% physical assist to advance R LE appropriately.  Pt required 50% VC's on proper sequencing and walker placement. Pt only able to tolerate amb to and from the bathroom.  Max c/o fatigue.  Gait Pattern: Step-to pattern;Decreased stance time - right;Decreased step length - left;Trunk flexed Gait velocity: decreased General Gait Details: very slow and sluggish    Exercises   Total Hip Replacement TE's 10 reps ankle pumps 10 reps knee presses 10 reps AAROM heel slides 10 reps AAROM SAQ's 10 reps AAROM ABD    PT Goals                                                            Progressing slowly     Visit Information  Last PT Received On: 05/25/13 Assistance Needed: +1    Subjective Data      Cognition    delayed   Balance   poor  End of Session PT - End of Session Equipment Utilized During Treatment: Gait belt Activity Tolerance: Patient limited by fatigue Patient left: in bed;with call bell/phone within reach;with family/visitor present   Felecia Shelling  PTA [facility]  Acute  Rehab Pager      [phone]

## 2013-05-25 NOTE — Progress Notes (Signed)
Physical Therapy Treatment Patient Details Name: [name] MRN: [id number] DOB: [birth year] Today's Date: 05/25/2013 Time: 6440-3474 PT Time Calculation (min): 48 min  PT Assessment / Plan / Recommendation Comments on Treatment Session  POD # 3 R THR posterior approach pm session.  Pt stated, "Oh, you came back". Assisted pt OOB to amb limited distance to BR to void then amb in hallway while spouse followed with recliner.  Pt still awaiting BC/BS approval for ST Rehab at [facility].    Follow Up Recommendations  SNF     Does the patient have the potential to tolerate intense rehabilitation     Barriers to Discharge        Equipment Recommendations  None recommended by PT    Recommendations for Other Services    Frequency 7X/week   Plan Discharge plan remains appropriate;Frequency remains appropriate    Precautions / Restrictions Precautions Precautions: Posterior Hip;Fall Precaution Comments: requires 50% VC's to avoid hip flex >90 degrees with sit to stand to sit Restrictions Weight Bearing Restrictions: No RLE Weight Bearing: Weight bearing as tolerated   Pertinent Vitals/Pain C/o "soreness" ICE applied    Mobility  Bed Mobility Bed Mobility: Supine to Sit Supine to Sit: 3: Mod assist;4: Min assist Sit to Supine: 2: Max assist Details for Bed Mobility Assistance: Great difficulty and nearly 4 min to get from supine to EOB. Transfers Transfers: Sit to Stand;Stand to Sit Sit to Stand: 2: Max assist Sit to Stand: Patient Percentage: 60% Stand to Sit: 3: Mod assist;2: Max assist Details for Transfer Assistance: 50% VC's on proper tech and hand placement and to esp avoid leaning too far forward due to her THP.  Pt requires increased time and had difficulty getting herself up off toilet even with 3;1 over top. Ambulation/Gait Ambulation/Gait Assistance: 4: Min assist Ambulation Distance (Feet): 37 Feet Assistive device: Rolling walker Ambulation/Gait Assistance  Details: slow gait with difficulty advancing R LE. Initially pt does the "toe scrunch" to advance R foot.  50% VC's on proper sequencing and 75% VC's safety with turns completing and backward gait sequencing prior to sit. Gait Pattern: Step-to pattern;Decreased stance time - right;Decreased step length - left;Trunk flexed Gait velocity: decreased General Gait Details: very slow and sluggish     PT Goals                                                progressing    Visit Information  Last PT Received On: 05/25/13 Assistance Needed: +1    Subjective Data  Subjective: "Oh, you came back"   Cognition    good Delayed/slow/sluggish/tiered   Balance   fair  End of Session PT - End of Session Equipment Utilized During Treatment: Gait belt Activity Tolerance: Patient limited by fatigue Patient left: in chair;with call bell/phone within reach;with family/visitor present   Felecia Shelling  PTA [facility]  Acute  Rehab Pager      [phone]

## 2013-05-26 MED ORDER — DIPHENHYDRAMINE HCL 25 MG PO CAPS: 25.0000 mg | ORAL_CAPSULE | Freq: Four times a day (QID) | ORAL | Status: DC | PRN

## 2013-05-26 NOTE — Progress Notes (Signed)
Physical Therapy Treatment Patient Details Name: [name] MRN: [id number] DOB: 1951/11/21 Today's Date: 05/26/2013 Time: 4098-1191 PT Time Calculation (min): 49 min  PT Assessment / Plan / Recommendation Comments on Treatment Session  POD # 4 R THR posterior approach. Patient and husband wanting to return home now instead of rehab at [facility]. Patient able to practice steps and increase ambulation. Overall great improvement with mobility. Patient has help at home 24/7. Will ambulate again later today and should anticipate DC home tomorrow if ok with MD    Follow Up Recommendations  Home health PT;Supervision for mobility/OOB;Supervision/Assistance - 24 hour     Does the patient have the potential to tolerate intense rehabilitation     Barriers to Discharge        Equipment Recommendations  Rolling walker with 5" wheels (3n1)    Recommendations for Other Services    Frequency 7X/week   Plan Frequency remains appropriate;Discharge plan needs to be updated    Precautions / Restrictions Precautions Precautions: Posterior Hip;Fall Precaution Comments: requires 50% VC's to avoid hip flex >90 degrees with sit to stand to sit Restrictions Weight Bearing Restrictions: Yes RLE Weight Bearing: Weight bearing as tolerated   Pertinent Vitals/Pain denied    Mobility  Bed Mobility Bed Mobility: Not assessed Transfers Sit to Stand: 4: Min assist;With armrests;From chair/3-in-1 Stand to Sit: 4: Min guard;With armrests;To chair/3-in-1 Details for Transfer Assistance: Patient stood x3 needing less assistance each stand. Cues for proper technique and not to fall back into recliner Ambulation/Gait Ambulation/Gait Assistance: 4: Min guard Ambulation Distance (Feet): 125 Feet Assistive device: Rolling walker Ambulation/Gait Assistance Details: Patient totaled 125" of ambulation today and making steady progress. Able to advance R LE without assistance Gait Pattern: Step-to  pattern;Decreased stance time - right;Decreased step length - left Gait velocity: decreased Stairs: Yes Stairs Assistance: 4: Min guard Stairs Assistance Details (indicate cue type and reason): CUes for technique and sequency Stair Management Technique: Step to pattern;Forwards;Two rails Number of Stairs: 5    Exercises Total Joint Exercises Long Arc Quad: AROM;Right;10 reps   PT Diagnosis:    PT Problem List:   PT Treatment Interventions:     PT Goals Acute Rehab PT Goals PT Goal: Sit to Stand - Progress: Progressing toward goal PT Goal: Stand to Sit - Progress: Progressing toward goal PT Goal: Ambulate - Progress: Progressing toward goal PT Goal: Up/Down Stairs - Progress: Progressing toward goal PT Goal: Perform Home Exercise Program - Progress: Progressing toward goal Additional Goals PT Goal: Additional Goal #1 - Progress: Progressing toward goal  Visit Information  Last PT Received On: 05/26/13 Assistance Needed: +1    Subjective Data      Cognition  Cognition Arousal/Alertness: Awake/alert Behavior During Therapy: WFL for tasks assessed/performed Overall Cognitive Status: Within Functional Limits for tasks assessed    Balance     End of Session PT - End of Session Equipment Utilized During Treatment: Gait belt Activity Tolerance: Patient tolerated treatment well Patient left: in chair;with call bell/phone within reach Nurse Communication: Mobility status   GP     [name] 05/26/2013, 10:15 AM

## 2013-05-26 NOTE — Progress Notes (Signed)
Seen in room 5N27  Afebrile vital signs stable  S: Has not taken a pain pill since yesterday, but the were making her itch, minimal pain right now. Did stairs this am with PT.  Wants ot go home tomorrow.  O: Seated comfortably in chair  A: Stable  P: probable discharge tomorrow, benadryl prn for itch

## 2013-05-26 NOTE — Progress Notes (Signed)
Occupational Therapy Treatment Patient Details Name: [name] MRN: [id number] DOB: 1951-10-21 Today's Date: 05/26/2013 Time: 4098-1191 OT Time Calculation (min): 23 min  OT Assessment / Plan / Recommendation Comments on Treatment Session Patient verbalized understanding of AE use for LB self-care. Will review ADL transfers/AE as needed tomorrow prior to d/c home.    Follow Up Recommendations  Home health OT;Supervision/Assistance - 24 hour    Barriers to Discharge       Equipment Recommendations  Tub/shower bench;3 in 1 bedside comode    Recommendations for Other Services    Frequency Min 2X/week   Plan Discharge plan needs to be updated    Precautions / Restrictions Precautions Precautions: Posterior Hip;Fall Precaution Comments: requires 75% VC's to avoid hip flex >90 degrees with sit to stand to sit Restrictions Weight Bearing Restrictions: Yes RLE Weight Bearing: Weight bearing as tolerated   Pertinent Vitals/Pain     ADL  Lower Body Bathing: Simulated;Moderate assistance Where Assessed - Lower Body Bathing: Supported sit to stand Lower Body Dressing: Simulated;Moderate assistance Where Assessed - Lower Body Dressing: Supported sit to stand Toilet Transfer: Hydrographic surveyor Method: Sit to Barista: Bedside commode Transfers/Ambulation Related to ADLs: Patient has improved mobility, min guard A with transfers. ADL Comments: Patient educated on AE for LB self-care this session. Plans to purchase at gift shop prior to d/c. Husband also observed AE instruction and verbalized understanding. Pt. reports she will sponge bathe initially at home.    OT Diagnosis:    OT Problem List:   OT Treatment Interventions:     OT Goals ADL Goals ADL Goal: Lower Body Bathing - Progress: Progressing toward goals ADL Goal: Lower Body Dressing - Progress: Progressing toward goals ADL Goal: Toilet Transfer - Progress: Progressing toward  goals Miscellaneous OT Goals OT Goal: Miscellaneous Goal #1 - Progress: Progressing toward goals  Visit Information  Last OT Received On: 05/26/13 Assistance Needed: +1    Subjective Data      Prior Functioning       Cognition  Cognition Arousal/Alertness: Awake/alert Behavior During Therapy: WFL for tasks assessed/performed Overall Cognitive Status: Within Functional Limits for tasks assessed    Mobility  Bed Mobility Bed Mobility: Not assessed Details for Bed Mobility Assistance: Will review in AM Transfers Sit to Stand: 4: Min guard;With upper extremity assist;With armrests;From chair/3-in-1 Stand to Sit: To chair/3-in-1;With armrests;With upper extremity assist;4: Min guard Details for Transfer Assistance: . Cues for proper technique and not to fall back into recliner    Exercises  Total Joint Exercises Long Arc Quad: AROM;Right;10 reps   Balance     End of Session OT - End of Session Equipment Utilized During Treatment: Other (comment) (AE) Activity Tolerance: Patient tolerated treatment well Patient left: in bed;with call bell/phone within reach;with family/visitor present  GO     Aleta Manternach A 05/26/2013, 1:09 PM

## 2013-05-26 NOTE — Progress Notes (Signed)
Physical Therapy Treatment Patient Details Name: MISHEL SANS MRN: [id number] DOB: 12-Jun-1951 Today's Date: 05/26/2013 Time: 2130-8657 PT Time Calculation (min): 24 min  PT Assessment / Plan / Recommendation Comments on Treatment Session  Patient focused on ambulation second session. Will complete step again in AM and anticipate DC home tomorrow    Follow Up Recommendations  Home health PT;Supervision for mobility/OOB;Supervision/Assistance - 24 hour     Does the patient have the potential to tolerate intense rehabilitation     Barriers to Discharge        Equipment Recommendations  Rolling walker with 5" wheels    Recommendations for Other Services    Frequency 7X/week   Plan Frequency remains appropriate;Discharge plan remains appropriate    Precautions / Restrictions Precautions Precautions: Posterior Hip;Fall Precaution Comments: requires 75% VC's to avoid hip flex >90 degrees with sit to stand to sit Restrictions Weight Bearing Restrictions: Yes RLE Weight Bearing: Weight bearing as tolerated   Pertinent Vitals/Pain     Mobility  Bed Mobility Bed Mobility: Not assessed Details for Bed Mobility Assistance: Will review in AM Transfers Sit to Stand: 4: Min guard;With upper extremity assist;With armrests;From chair/3-in-1 Stand to Sit: To chair/3-in-1;With armrests;With upper extremity assist;4: Min guard Details for Transfer Assistance: . Cues for proper technique and not to fall back into recliner Ambulation/Gait Ambulation/Gait Assistance: 4: Min guard Ambulation Distance (Feet): 130 Feet Assistive device: Rolling walker Ambulation/Gait Assistance Details: Cues for posture.  Gait Pattern: Step-to pattern;Decreased stance time - right;Decreased step length - left Gait velocity: increasing Stairs: Yes Stairs Assistance: 4: Min guard Stairs Assistance Details (indicate cue type and reason): CUes for technique and sequency Stair Management Technique: Step to  pattern;Forwards;Two rails Number of Stairs: 5    Exercises Total Joint Exercises Long Arc Quad: AROM;Right;10 reps   PT Diagnosis:    PT Problem List:   PT Treatment Interventions:     PT Goals Acute Rehab PT Goals PT Goal: Sit to Stand - Progress: Progressing toward goal PT Goal: Stand to Sit - Progress: Progressing toward goal PT Goal: Ambulate - Progress: Progressing toward goal PT Goal: Up/Down Stairs - Progress: Progressing toward goal PT Goal: Perform Home Exercise Program - Progress: Progressing toward goal Additional Goals PT Goal: Additional Goal #1 - Progress: Progressing toward goal  Visit Information  Last PT Received On: 05/26/13 Assistance Needed: +1    Subjective Data      Cognition  Cognition Arousal/Alertness: Awake/alert Behavior During Therapy: WFL for tasks assessed/performed Overall Cognitive Status: Within Functional Limits for tasks assessed    Balance     End of Session PT - End of Session Equipment Utilized During Treatment: Gait belt Activity Tolerance: Patient tolerated treatment well Patient left: in chair;with call bell/phone within reach Nurse Communication: Mobility status   GP     [name] 05/26/2013, 11:41 AM 05/26/2013 [name] PTA [phone] pager [phone] office

## 2013-05-26 NOTE — Progress Notes (Signed)
Agree with change to D/C disposition.  [place], [place], [place] [phone]

## 2013-05-27 DIAGNOSIS — IMO0002 Reserved for concepts with insufficient information to code with codable children: Secondary | ICD-10-CM | POA: Clinically undetermined

## 2013-05-27 DIAGNOSIS — D62 Acute posthemorrhagic anemia: Secondary | ICD-10-CM | POA: Diagnosis not present

## 2013-05-27 MED ORDER — CELECOXIB 200 MG PO CAPS: 200.0000 mg | ORAL_CAPSULE | Freq: Two times a day (BID) | ORAL | Status: DC

## 2013-05-27 MED ORDER — HYDROCODONE-ACETAMINOPHEN 10-325 MG PO TABS: 1.0000 | ORAL_TABLET | Freq: Four times a day (QID) | ORAL | Status: DC | PRN

## 2013-05-27 MED ORDER — ENOXAPARIN SODIUM 40 MG/0.4ML ~~LOC~~ SOLN: 40.0000 mg | SUBCUTANEOUS | Status: DC

## 2013-05-27 MED ORDER — DSS 100 MG PO CAPS: 100.0000 mg | ORAL_CAPSULE | Freq: Two times a day (BID) | ORAL | Status: DC

## 2013-05-27 NOTE — Progress Notes (Signed)
Subjective: 5 Days Post-Op Procedure(s) (LRB): TOTAL HIP ARTHROPLASTY- right  (Right) Patient reports pain as 2 on 0-10 scale.  Feels ready to go home, bypassing the rehab stay, due to adequate advance with PT   Objective: Vital signs in last 24 hours: Temp:  [98.3 F (36.8 C)-99.1 F (37.3 C)] 99.1 F (37.3 C) (06/23 0539) Pulse Rate:  [60-80] 60 (06/23 0539) Resp:  [16-18] 16 (06/23 0539) BP: (155-156)/(72-79) 155/79 mmHg (06/23 0539) SpO2:  [98 %-99 %] 99 % (06/23 0539)  Intake/Output from previous day: 06/22 0701 - 06/23 0700 In: 720 [P.O.:720] Out: -  Intake/Output this shift:     Recent Labs  05/25/13 0625  HGB 8.4*    Recent Labs  05/25/13 0625  WBC 9.4  RBC 3.16*  HCT 26.7*  PLT 220   No results found for this basename: NA, K, CL, CO2, BUN, CREATININE, GLUCOSE, CALCIUM,  in the last 72 hours No results found for this basename: LABPT, INR,  in the last 72 hours  ABD soft Neurovascular intact Incision: moderate drainage Compartment soft No SOI  Assessment/Plan: 5 Days Post-Op Procedure(s) (LRB): TOTAL HIP ARTHROPLASTY- right  (Right) Discharge home with home health Dressing change prior to d/c  [name] B 05/27/2013, 11:27 AM

## 2013-05-27 NOTE — Progress Notes (Signed)
Physical Therapy Treatment Patient Details Name: [name] MRN: [id number] DOB: May 25, 1951 Today's Date: 05/27/2013 Time: 4098-1191 PT Time Calculation (min): 55 min  PT Assessment / Plan / Recommendation Comments on Treatment Session  Patient progressing well. Very eager to return home today. Has reviewed and met goals necessary to DC home today    Follow Up Recommendations  Home health PT;Supervision for mobility/OOB;Supervision/Assistance - 24 hour     Does the patient have the potential to tolerate intense rehabilitation     Barriers to Discharge        Equipment Recommendations  Rolling walker with 5" wheels    Recommendations for Other Services    Frequency 7X/week   Plan Discharge plan remains appropriate;Frequency remains appropriate    Precautions / Restrictions Precautions Precautions: Posterior Hip;Fall Precaution Comments: requires 75% VC's to avoid hip flex >90 degrees with sit to stand to sit Restrictions Weight Bearing Restrictions: Yes RLE Weight Bearing: Weight bearing as tolerated   Pertinent Vitals/Pain denied    Mobility  Bed Mobility Supine to Sit: 5: Supervision Sit to Supine: 5: Supervision Details for Bed Mobility Assistance: increased time Transfers Sit to Stand: 4: Min guard;With armrests;From bed;From chair/3-in-1 Stand to Sit: 4: Min guard;With armrests;To chair/3-in-1 Details for Transfer Assistance: Cues to slide out R LE Ambulation/Gait Ambulation/Gait Assistance: 5: Supervision Assistive device: Rolling walker Gait Pattern: Step-through pattern;Decreased stride length Stairs Assistance: 5: Supervision Stairs Assistance Details (indicate cue type and reason): reviewed for practice Stair Management Technique: Step to pattern;Forwards;Two rails Number of Stairs: 2    Exercises Total Joint Exercises Quad Sets: AROM;Left;15 reps Heel Slides: AAROM;Right;15 reps Hip ABduction/ADduction: AAROM;Right;15 reps Long Arc Quad:  AROM;Right;15 reps   PT Diagnosis:    PT Problem List:   PT Treatment Interventions:     PT Goals Acute Rehab PT Goals PT Goal: Supine/Side to Sit - Progress: Progressing toward goal PT Goal: Sit to Stand - Progress: Met PT Goal: Stand to Sit - Progress: Met PT Goal: Ambulate - Progress: Met PT Goal: Up/Down Stairs - Progress: Met PT Goal: Perform Home Exercise Program - Progress: Progressing toward goal Additional Goals PT Goal: Additional Goal #1 - Progress: Progressing toward goal  Visit Information  Last PT Received On: 05/27/13 Assistance Needed: +1    Subjective Data      Cognition  Cognition Arousal/Alertness: Awake/alert Behavior During Therapy: WFL for tasks assessed/performed Overall Cognitive Status: Within Functional Limits for tasks assessed    Balance     End of Session PT - End of Session Equipment Utilized During Treatment: Gait belt Activity Tolerance: Patient tolerated treatment well Patient left: in chair;with call bell/phone within reach Nurse Communication: Mobility status   GP     [name] 05/27/2013, 8:53 AM 05/27/2013 [name] PTA [phone] pager [phone] office

## 2013-05-27 NOTE — Care Management Note (Signed)
    Page 1 of 2   05/27/2013     11:19:44 AM   CARE MANAGEMENT NOTE 05/27/2013  Patient:  [name], [name]   Account Number:  [id number]  Date Initiated:  05/23/2013  Documentation initiated by:  [facility]  Subjective/Objective Assessment:   admitted postop rt total hip     Action/Plan:   plan is home with HHPT/OT   Anticipated DC Date:  05/27/2013   Anticipated DC Plan:  HOME W HOME HEALTH SERVICES  In-house referral  Clinical Social Worker      DC Planning Services  CM consult      Choice offered to / List presented to:          [facility] arranged  HH-2 PT  HH-3 OT      [facility] agency  [facility]   Status of service:  Completed, signed off Medicare Important Message given?   (If response is "NO", the following Medicare IM given date fields will be blank) Date Medicare IM given:   Date Additional Medicare IM given:    Discharge Disposition:  HOME W HOME HEALTH SERVICES  Per UR Regulation:  Reviewed for med. necessity/level of care/duration of stay  If discussed at Long Length of Stay Meetings, dates discussed:    Comments:  05/27/13 Spoke with patient and her husband, they have decided for patient to go back home with [facility] instead of SNF. They would still like [facility] for HHPT/OT. Contacted Marie at [facility] and let her know patient will be going home with HHPT/OT. Contacted Brent at [employer] and let him know that patient will be going home instead of SNF. Jacquelynn Cree RN, BSN, CCM  05/23/13 Patient was set up preoperatively with [facility] for therapy and [employer] for equipment. Currently d/c plan is for plan is SNF. CSW working on placement. Will continue to follow. Jacquelynn Cree RN, BSN, CCM

## 2013-05-27 NOTE — Progress Notes (Signed)
Patient provided with discharge and follow up information. Dressing changed and educated on the administration of Lovenox to prevent blood clots. She is going home with the support of her husband.

## 2013-05-27 NOTE — Progress Notes (Signed)
Patient plans to go home with home health. Clinical Social Worker will sign off for now as social work intervention is no longer needed. Please consult [place] again if new need arises.   Sabino Niemann, MSW, [phone]

## 2013-05-27 NOTE — Progress Notes (Signed)
Occupational Therapy Treatment Patient Details Name: [name] MRN: [id number] DOB: September 08, 1951 Today's Date: 05/27/2013 Time: 8295-6213 OT Time Calculation (min): 8 min  OT Assessment / Plan / Recommendation Comments on Treatment Session Patient to discharge home today with family A as needed.    Follow Up Recommendations  Home health OT;Supervision/Assistance - 24 hour    Barriers to Discharge       Equipment Recommendations  Tub/shower bench;3 in 1 bedside comode    Recommendations for Other Services    Frequency Min 2X/week   Plan Discharge plan remains appropriate    Precautions / Restrictions Precautions Precautions: Posterior Hip;Fall Restrictions Weight Bearing Restrictions: Yes RLE Weight Bearing: Weight bearing as tolerated   Pertinent Vitals/Pain     ADL  ADL Comments: Reviewed AE use and tub transfer technique with 3 in 1. Pt. and husband verbalize understanding. Pt. states she will sponge bathe initially. Pt. has obtained AE for LB self-care from gift shop.         OT Goals    Visit Information  Last OT Received On: 05/27/13 Assistance Needed: +1    Cognition  Cognition Arousal/Alertness: Awake/alert Behavior During Therapy: WFL for tasks assessed/performed Overall Cognitive Status: Within Functional Limits for tasks assessed    Mobility       Balance     End of Session OT - End of Session Equipment Utilized During Treatment: Other (comment) (AE) Activity Tolerance: Patient tolerated treatment well Patient left: in chair;with call bell/phone within reach;with family/visitor present  GO     Keidy Thurgood A 05/27/2013, 12:48 PM

## 2013-06-10 ENCOUNTER — Other Ambulatory Visit: Payer: Self-pay | Admitting: Orthopedic Surgery

## 2013-06-30 ENCOUNTER — Other Ambulatory Visit: Payer: Self-pay | Admitting: Orthopedic Surgery

## 2014-06-07 IMAGING — CR DG CHEST 2V
2 series · 2 of 2 positions shown · non-contrast
Comparison: None.

CLINICAL DATA: preoperative total hip arthroplasty

CHEST - 2 VIEW

[view not recorded (1 of 2)]
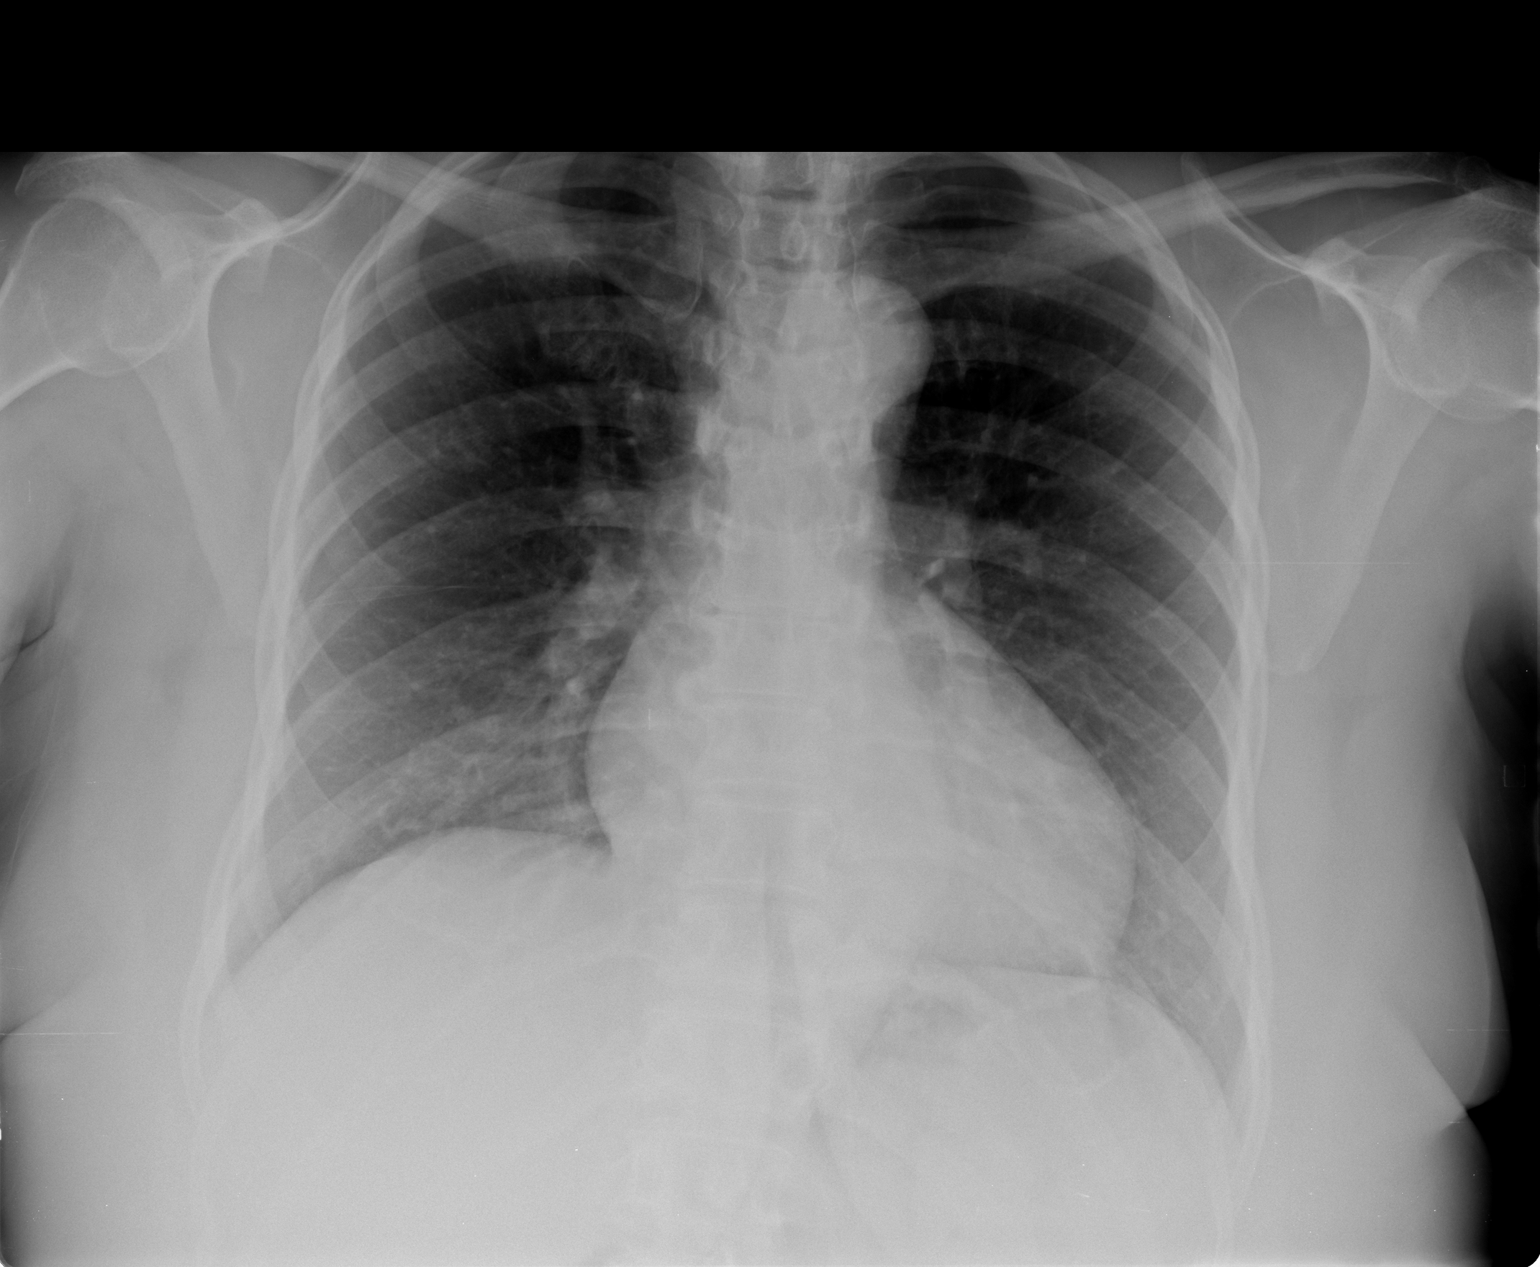

[view not recorded (2 of 2)]
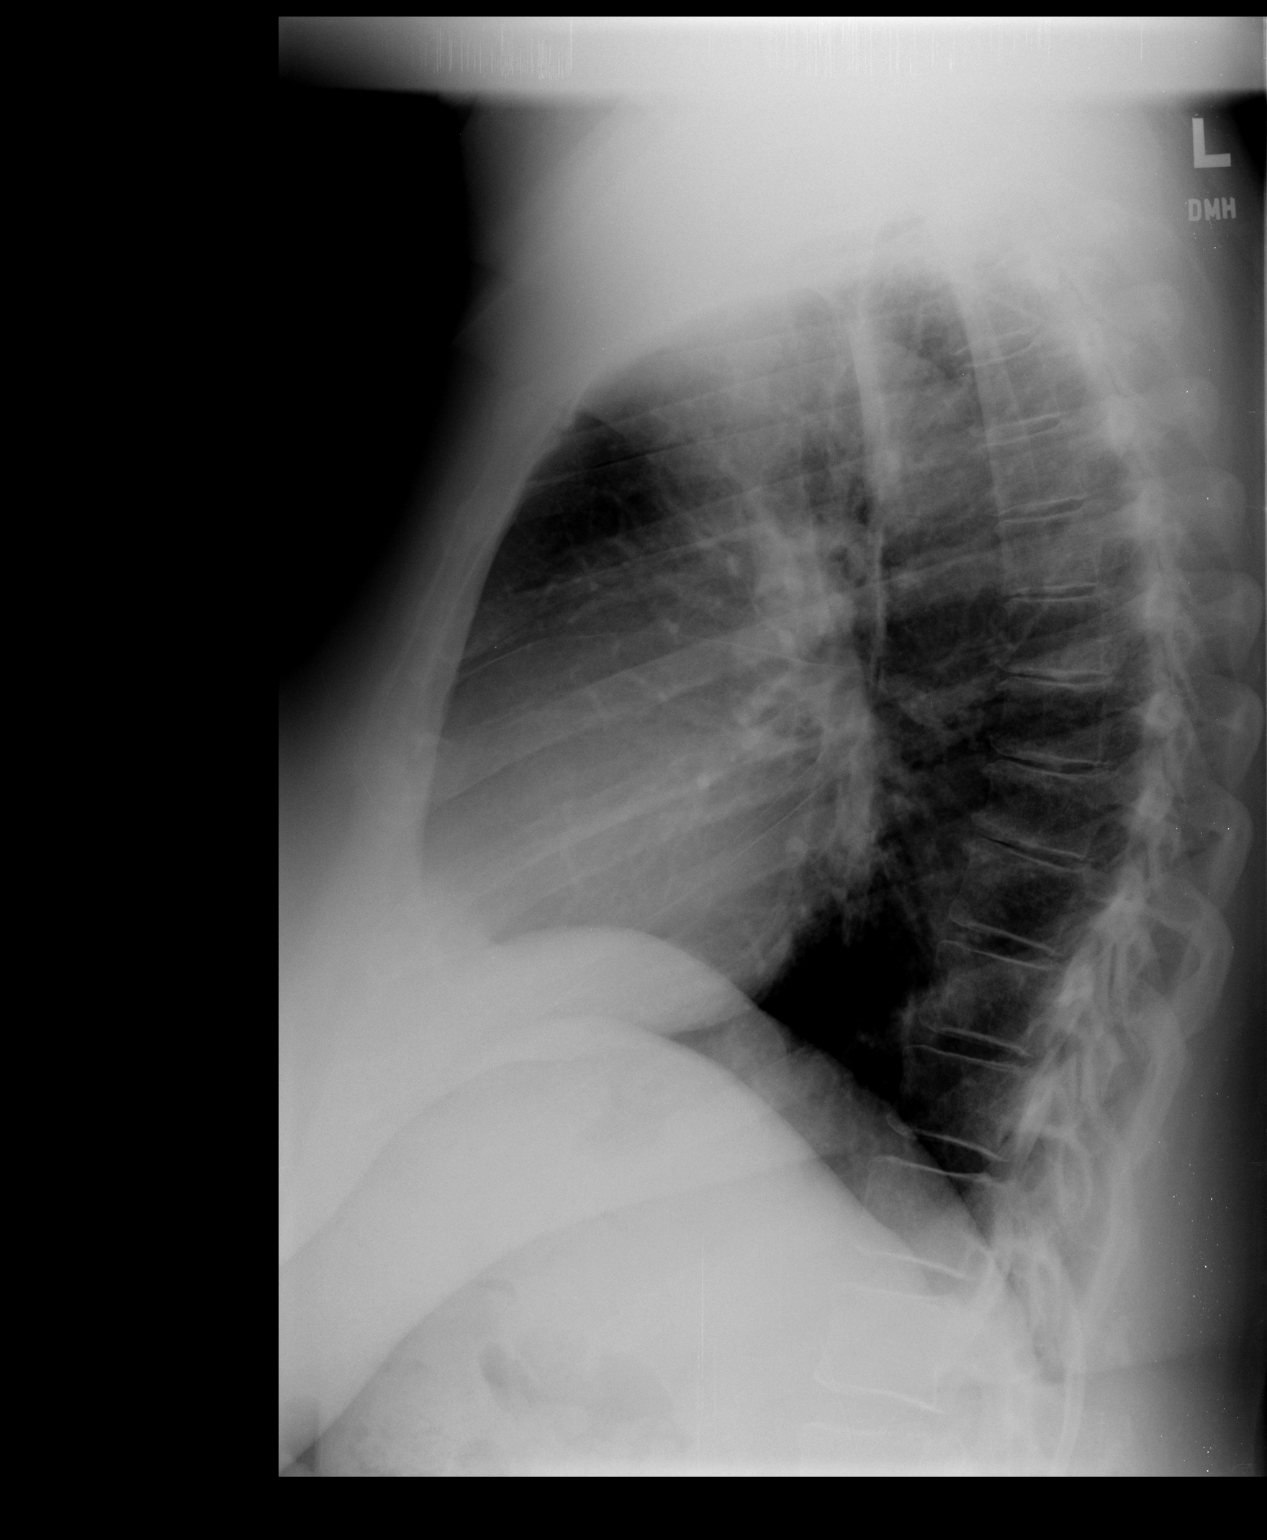

[2 of 2 positions shown; findings below may reference images not displayed]

FINDINGS: Lungs clear.  Heart is upper normal in size with normal
pulmonary vascularity.  No adenopathy.  There is mild degenerative
change in the thoracic spine.
IMPRESSION: No edema or consolidation.

## 2014-06-15 IMAGING — DX DG HIP 1V PORT*R*
1 series · 1 of 1 positions shown · non-contrast
Comparison: None.

CLINICAL DATA: Right hip replacement.

PORTABLE RIGHT HIP - 1 VIEW

[lat]
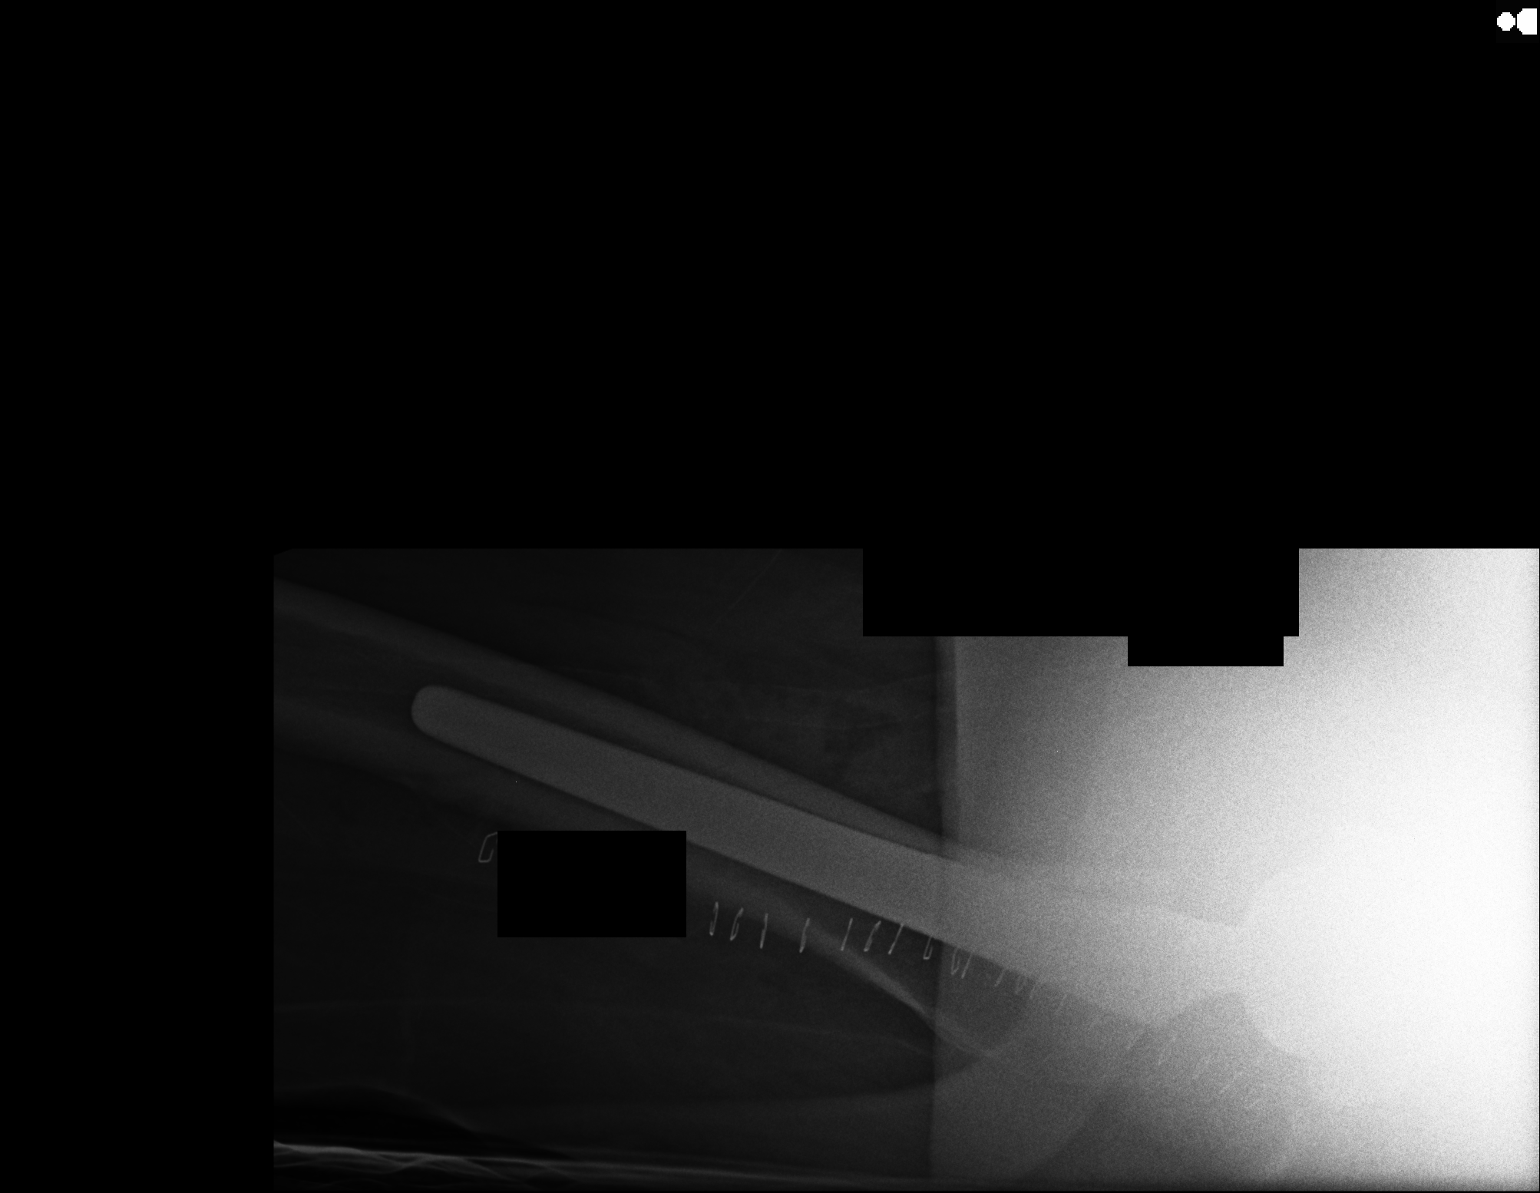

[1 of 1 positions shown; findings below may reference images not displayed]

FINDINGS: Right total hip arthroplasty is in place.  There is no
fracture.  The device is located.  Surgical staples are noted.
IMPRESSION: Right hip replacement evidence of complication.

## 2014-06-15 IMAGING — DX DG PORTABLE PELVIS
1 series · 1 of 1 positions shown · non-contrast
Comparison: None.

CLINICAL DATA: Hip replacement.

PORTABLE PELVIS

[ap]
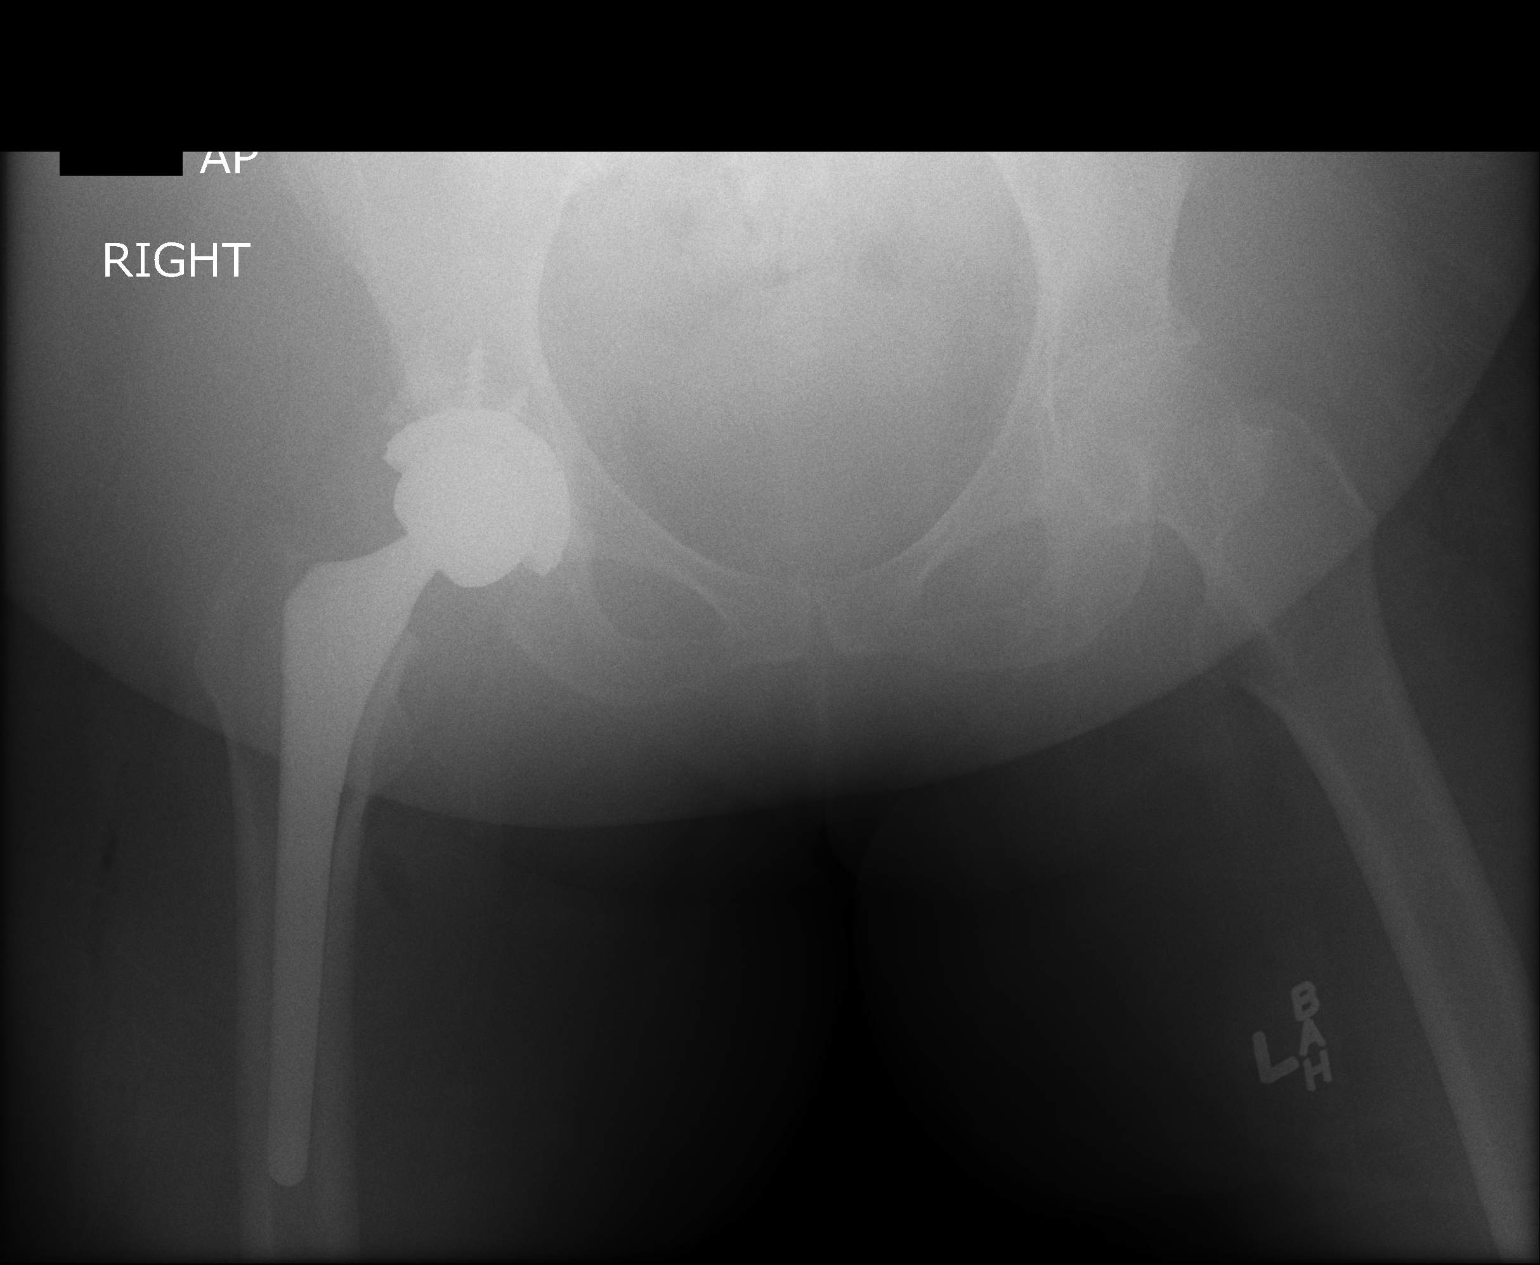

[1 of 1 positions shown; findings below may reference images not displayed]

FINDINGS: The patient has a right total hip arthroplasty in place.
The device is located and there is no fracture.  Left hip
degenerative change is noted.
IMPRESSION: Status post right hip replacement without evidence of complication.

## 2019-12-18 ENCOUNTER — Encounter (HOSPITAL_COMMUNITY): Payer: Self-pay | Admitting: Emergency Medicine

## 2019-12-18 ENCOUNTER — Inpatient Hospital Stay (HOSPITAL_COMMUNITY)
Admission: EM | Admit: 2019-12-18 | Discharge: 2019-12-20 | DRG: 065 | Disposition: A | Discharge status: Home / Self Care | Payer: Medicare Other | Source: Ambulatory Visit | Attending: Internal Medicine | Admitting: Internal Medicine

## 2019-12-18 ENCOUNTER — Inpatient Hospital Stay (HOSPITAL_COMMUNITY): Payer: Medicare Other

## 2019-12-18 ENCOUNTER — Other Ambulatory Visit: Payer: Self-pay

## 2019-12-18 ENCOUNTER — Emergency Department (HOSPITAL_COMMUNITY): Payer: Medicare Other

## 2019-12-18 DIAGNOSIS — Z7289 Other problems related to lifestyle: Secondary | ICD-10-CM | POA: Diagnosis not present

## 2019-12-18 DIAGNOSIS — R29701 NIHSS score 1: Secondary | ICD-10-CM | POA: Diagnosis present

## 2019-12-18 DIAGNOSIS — R270 Ataxia, unspecified: Secondary | ICD-10-CM | POA: Diagnosis present

## 2019-12-18 DIAGNOSIS — E785 Hyperlipidemia, unspecified: Secondary | ICD-10-CM | POA: Diagnosis present

## 2019-12-18 DIAGNOSIS — E669 Obesity, unspecified: Secondary | ICD-10-CM | POA: Diagnosis present

## 2019-12-18 DIAGNOSIS — G8194 Hemiplegia, unspecified affecting left nondominant side: Secondary | ICD-10-CM | POA: Diagnosis present

## 2019-12-18 DIAGNOSIS — I10 Essential (primary) hypertension: Secondary | ICD-10-CM | POA: Diagnosis present

## 2019-12-18 DIAGNOSIS — M1990 Unspecified osteoarthritis, unspecified site: Secondary | ICD-10-CM | POA: Diagnosis present

## 2019-12-18 DIAGNOSIS — Z6838 Body mass index (BMI) 38.0-38.9, adult: Secondary | ICD-10-CM | POA: Diagnosis not present

## 2019-12-18 DIAGNOSIS — Z9181 History of falling: Secondary | ICD-10-CM | POA: Diagnosis not present

## 2019-12-18 DIAGNOSIS — K219 Gastro-esophageal reflux disease without esophagitis: Secondary | ICD-10-CM | POA: Diagnosis present

## 2019-12-18 DIAGNOSIS — I639 Cerebral infarction, unspecified: Secondary | ICD-10-CM | POA: Diagnosis present

## 2019-12-18 DIAGNOSIS — Z96641 Presence of right artificial hip joint: Secondary | ICD-10-CM | POA: Diagnosis present

## 2019-12-18 DIAGNOSIS — Z791 Long term (current) use of non-steroidal anti-inflammatories (NSAID): Secondary | ICD-10-CM

## 2019-12-18 DIAGNOSIS — Z20822 Contact with and (suspected) exposure to covid-19: Secondary | ICD-10-CM | POA: Diagnosis present

## 2019-12-18 DIAGNOSIS — I160 Hypertensive urgency: Secondary | ICD-10-CM | POA: Diagnosis present

## 2019-12-18 DIAGNOSIS — I6502 Occlusion and stenosis of left vertebral artery: Secondary | ICD-10-CM | POA: Diagnosis present

## 2019-12-18 DIAGNOSIS — I361 Nonrheumatic tricuspid (valve) insufficiency: Secondary | ICD-10-CM | POA: Diagnosis not present

## 2019-12-18 DIAGNOSIS — Z79899 Other long term (current) drug therapy: Secondary | ICD-10-CM

## 2019-12-18 LAB — APTT: aPTT: 31 seconds (ref 24–36)

## 2019-12-18 LAB — PROTIME-INR
INR: 1 (ref 0.8–1.2)
Prothrombin Time: 12.8 seconds (ref 11.4–15.2)

## 2019-12-18 LAB — I-STAT CHEM 8, ED
BUN: 18 mg/dL (ref 8–23)
Calcium, Ion: 1.14 mmol/L — ABNORMAL LOW (ref 1.15–1.40)
Chloride: 108 mmol/L (ref 98–111)
Creatinine, Ser: 0.6 mg/dL (ref 0.44–1.00)
Glucose, Bld: 120 mg/dL — ABNORMAL HIGH (ref 70–99)
HCT: 42 % (ref 36.0–46.0)
Hemoglobin: 14.3 g/dL (ref 12.0–15.0)
Potassium: 3.6 mmol/L (ref 3.5–5.1)
Sodium: 142 mmol/L (ref 135–145)
TCO2: 26 mmol/L (ref 22–32)

## 2019-12-18 LAB — COMPREHENSIVE METABOLIC PANEL
ALT: 20 U/L (ref 0–44)
AST: 25 U/L (ref 15–41)
Albumin: 4.2 g/dL (ref 3.5–5.0)
Alkaline Phosphatase: 68 U/L (ref 38–126)
Anion gap: 10 (ref 5–15)
BUN: 14 mg/dL (ref 8–23)
CO2: 25 mmol/L (ref 22–32)
Calcium: 9.5 mg/dL (ref 8.9–10.3)
Chloride: 106 mmol/L (ref 98–111)
Creatinine, Ser: 0.64 mg/dL (ref 0.44–1.00)
GFR calc Af Amer: >60 mL/min (ref >60)
GFR calc non Af Amer: >60 mL/min (ref >60)
Glucose, Bld: 123 mg/dL — ABNORMAL HIGH (ref 70–99)
Potassium: 3.6 mmol/L (ref 3.5–5.1)
Sodium: 141 mmol/L (ref 135–145)
Total Bilirubin: 0.9 mg/dL (ref 0.3–1.2)
Total Protein: 7.9 g/dL (ref 6.5–8.1)

## 2019-12-18 LAB — DIFFERENTIAL
Abs Immature Granulocytes: 0.01 10*3/uL (ref 0.00–0.07)
Basophils Absolute: 0 10*3/uL (ref 0.0–0.1)
Basophils Relative: 0 %
Eosinophils Absolute: 0 10*3/uL (ref 0.0–0.5)
Eosinophils Relative: 0 %
Immature Granulocytes: 0 %
Lymphocytes Relative: 24 %
Lymphs Abs: 1.3 10*3/uL (ref 0.7–4.0)
Monocytes Absolute: 0.4 10*3/uL (ref 0.1–1.0)
Monocytes Relative: 8 %
Neutro Abs: 3.7 10*3/uL (ref 1.7–7.7)
Neutrophils Relative %: 68 %

## 2019-12-18 LAB — CBC
HCT: 42.2 % (ref 36.0–46.0)
Hemoglobin: 12.7 g/dL (ref 12.0–15.0)
MCH: 26.4 pg (ref 26.0–34.0)
MCHC: 30.1 g/dL (ref 30.0–36.0)
MCV: 87.7 fL (ref 80.0–100.0)
Platelets: 269 10*3/uL (ref 150–400)
RBC: 4.81 MIL/uL (ref 3.87–5.11)
RDW: 13.4 % (ref 11.5–15.5)
WBC: 5.4 10*3/uL (ref 4.0–10.5)
nRBC: 0 % (ref 0.0–0.2)

## 2019-12-18 LAB — SARS CORONAVIRUS 2 (TAT 6-24 HRS): SARS Coronavirus 2: NEGATIVE

## 2019-12-18 LAB — HIV ANTIBODY (ROUTINE TESTING W REFLEX): HIV Screen 4th Generation wRfx: NONREACTIVE

## 2019-12-18 MED ORDER — LORAZEPAM 2 MG/ML IJ SOLN
1.0000 mg | Freq: Once | INTRAMUSCULAR | Status: AC
  Administered 2019-12-18: 1 mg via INTRAVENOUS
  Filled 2019-12-18: qty 1

## 2019-12-18 MED ORDER — ASPIRIN 300 MG RE SUPP: 300.0000 mg | Freq: Every day | RECTAL | Status: DC

## 2019-12-18 MED ORDER — CLOPIDOGREL BISULFATE 75 MG PO TABS
75.0000 mg | ORAL_TABLET | Freq: Every day | ORAL | Status: DC
  Administered 2019-12-18 – 2019-12-20 (×3): 75 mg via ORAL
  Filled 2019-12-18 (×3): qty 1

## 2019-12-18 MED ORDER — VITAMIN B-12 100 MCG PO TABS
100.0000 ug | ORAL_TABLET | Freq: Every day | ORAL | Status: DC
  Administered 2019-12-19 – 2019-12-20 (×2): 100 ug via ORAL
  Filled 2019-12-18 (×3): qty 1

## 2019-12-18 MED ORDER — ACETAMINOPHEN 160 MG/5ML PO SOLN: 650.0000 mg | ORAL | Status: DC | PRN

## 2019-12-18 MED ORDER — SODIUM CHLORIDE 0.9% FLUSH: 3.0000 mL | Freq: Once | INTRAVENOUS | Status: DC

## 2019-12-18 MED ORDER — VITAMIN D 25 MCG (1000 UNIT) PO TABS
1000.0000 [IU] | ORAL_TABLET | Freq: Every day | ORAL | Status: DC
  Administered 2019-12-19 – 2019-12-20 (×2): 1000 [IU] via ORAL
  Filled 2019-12-18 (×2): qty 1

## 2019-12-18 MED ORDER — ACETAMINOPHEN 325 MG PO TABS: 650.0000 mg | ORAL_TABLET | ORAL | Status: DC | PRN

## 2019-12-18 MED ORDER — ASPIRIN EC 325 MG PO TBEC
325.0000 mg | DELAYED_RELEASE_TABLET | Freq: Every day | ORAL | Status: DC
  Administered 2019-12-18: 20:00:00 325 mg via ORAL
  Filled 2019-12-18: qty 1

## 2019-12-18 MED ORDER — ENOXAPARIN SODIUM 40 MG/0.4ML ~~LOC~~ SOLN
40.0000 mg | SUBCUTANEOUS | Status: DC
  Administered 2019-12-18 – 2019-12-19 (×2): 40 mg via SUBCUTANEOUS
  Filled 2019-12-18 (×2): qty 0.4

## 2019-12-18 MED ORDER — STROKE: EARLY STAGES OF RECOVERY BOOK
Freq: Once | Status: DC
  Filled 2019-12-18: qty 1

## 2019-12-18 MED ORDER — ACETAMINOPHEN 650 MG RE SUPP: 650.0000 mg | RECTAL | Status: DC | PRN

## 2019-12-18 MED ORDER — SODIUM CHLORIDE 0.9 % IV SOLN: INTRAVENOUS | Status: DC

## 2019-12-18 MED ORDER — ASPIRIN EC 81 MG PO TBEC
81.0000 mg | DELAYED_RELEASE_TABLET | Freq: Every day | ORAL | Status: DC
  Administered 2019-12-19 – 2019-12-20 (×2): 81 mg via ORAL
  Filled 2019-12-18 (×2): qty 1

## 2019-12-18 NOTE — ED Triage Notes (Addendum)
Pt reports that she woke up Monday morning with weakness and numbness to her L arm and L leg, saw her pcp this am who sent her here due to ataxia. Pts speech is clear, face symmetrical, equal grip strength bilaterally, decreased sensation to L arm, a/ox4, resp e/u, nad.

## 2019-12-18 NOTE — ED Notes (Signed)
ED TO INPATIENT HANDOFF REPORT  ED Nurse Name and Phone #: Lewanda Rife [phone]  S Name/Age/Gender [name] 69 y.o. female Room/Bed: 004C/004C  Code Status   Code Status: Full Code  Home/SNF/Other Home Patient oriented to: self, place, time and situation Is this baseline? Yes   Triage Complete: Triage complete  Chief Complaint Acute CVA (cerebrovascular accident) [facility]) [I63.9]  Triage Note Pt reports that she woke up Monday morning with weakness and numbness to her L arm and L leg, saw her pcp this am who sent her here due to ataxia. Pts speech is clear, face symmetrical, equal grip strength bilaterally, decreased sensation to L arm, a/ox4, resp e/u, nad.     Allergies Allergies  Allergen Reactions  . Glucosamine Anaphylaxis, Shortness Of Breath and Swelling  . Iodine Anaphylaxis, Shortness Of Breath and Swelling  . Shellfish-Derived Products Anaphylaxis, Shortness Of Breath and Swelling    Level of Care/Admitting Diagnosis ED Disposition    ED Disposition Condition Comment   Admit  Hospital Area: [facility] [[id number]]  Level of Care: Telemetry Medical [104]  Covid Evaluation: Asymptomatic Screening Protocol (No Symptoms)  Diagnosis: Acute CVA (cerebrovascular accident) [facility]) [[id number]]  Admitting Physician: Joycelyn Das [[id number]]  Attending Physician: Joycelyn Das [[id number]]  Estimated length of stay: past midnight tomorrow  Certification:: I certify this patient will need inpatient services for at least 2 midnights       B Medical/Surgery History Past Medical History:  Diagnosis Date  . Arthritis   . GERD (gastroesophageal reflux disease)    Past Surgical History:  Procedure Laterality Date  . NO PAST SURGERIES    . 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   --    --    --      Incision 05/22/13 Hip Right   05/22/13    1252     2401          Intake/Output Last 24 hours No intake or output data in the 24 hours ending 12/18/19 2059  Labs/Imaging Results for orders placed or performed during the hospital encounter of 12/18/19 (from the past 48 hour(s))  Protime-INR     Status: None   Collection Time: 12/18/19 11:15 AM  Result Value Ref Range   Prothrombin Time 12.8 11.4 - 15.2 seconds   INR 1.0 0.8 - 1.2    Comment: (NOTE) INR goal varies based on device and disease states. Performed at [facility], [address], [place], [place] [address]   APTT     Status: None   Collection Time: 12/18/19 11:15 AM  Result Value Ref Range   aPTT 31 24 - 36 seconds    Comment: Performed at [facility], [address] [place], [place] [address]  CBC     Status: None   Collection Time: 12/18/19 11:15 AM  Result Value Ref Range   WBC 5.4 4.0 - 10.5 K/uL   RBC 4.81 3.87 - 5.11 MIL/uL   Hemoglobin 12.7 12.0 -  15.0 g/dL   HCT 42.2 36.0 - 46.0 %   MCV 87.7 80.0 - 100.0 fL   MCH 26.4 26.0 - 34.0 pg   MCHC 30.1 30.0 - 36.0 g/dL   RDW 13.4 11.5 - 15.5 %   Platelets 269 150 - 400 K/uL   nRBC 0.0 0.0 - 0.2 %    Comment: Performed at [facility], [place] [address] [place], [place] [address]  Differential     Status: None   Collection Time: 12/18/19 11:15 AM  Result Value Ref Range   Neutrophils Relative % 68 %   Neutro Abs 3.7 1.7 - 7.7 K/uL   Lymphocytes Relative 24 %   Lymphs Abs 1.3 0.7 - 4.0 K/uL   Monocytes Relative 8 %   Monocytes Absolute 0.4 0.1 - 1.0 K/uL   Eosinophils Relative 0 %   Eosinophils Absolute 0.0 0.0 - 0.5 K/uL   Basophils Relative 0 %    Basophils Absolute 0.0 0.0 - 0.1 K/uL   Immature Granulocytes 0 %   Abs Immature Granulocytes 0.01 0.00 - 0.07 K/uL    Comment: Performed at [facility], [place] [address], [place], [place] [address]  Comprehensive metabolic panel     Status: Abnormal   Collection Time: 12/18/19 11:15 AM  Result Value Ref Range   Sodium 141 135 - 145 mmol/L   Potassium 3.6 3.5 - 5.1 mmol/L   Chloride 106 98 - 111 mmol/L   CO2 25 22 - 32 mmol/L   Glucose, Bld 123 (H) 70 - 99 mg/dL   BUN 14 8 - 23 mg/dL   Creatinine, Ser 0.64 0.44 - 1.00 mg/dL   Calcium 9.5 8.9 - 10.3 mg/dL   Total Protein 7.9 6.5 - 8.1 g/dL   Albumin 4.2 3.5 - 5.0 g/dL   AST 25 15 - 41 U/L   ALT 20 0 - 44 U/L   Alkaline Phosphatase 68 38 - 126 U/L   Total Bilirubin 0.9 0.3 - 1.2 mg/dL   GFR calc non Af Amer >60 >60 mL/min   GFR calc Af Amer >60 >60 mL/min   Anion gap 10 5 - 15    Comment: Performed at [facility], [place] [address], [place], [place] [address]  I-stat chem 8, ED     Status: Abnormal   Collection Time: 12/18/19 11:35 AM  Result Value Ref Range   Sodium 142 135 - 145 mmol/L   Potassium 3.6 3.5 - 5.1 mmol/L   Chloride 108 98 - 111 mmol/L   BUN 18 8 - 23 mg/dL   Creatinine, Ser 0.60 0.44 - 1.00 mg/dL   Glucose, Bld 120 (H) 70 - 99 mg/dL   Calcium, Ion 1.14 (L) 1.15 - 1.40 mmol/L   TCO2 26 22 - 32 mmol/L   Hemoglobin 14.3 12.0 - 15.0 g/dL   HCT 42.0 36.0 - 46.0 %  SARS CORONAVIRUS 2 (TAT 6-24 HRS) Nasopharyngeal Nasopharyngeal Swab     Status: None   Collection Time: 12/18/19 12:33 PM   Specimen: Nasopharyngeal Swab  Result Value Ref Range   SARS Coronavirus 2 NEGATIVE NEGATIVE    Comment: (NOTE) SARS-CoV-2 target nucleic acids are NOT DETECTED. The SARS-CoV-2 RNA is generally detectable in upper and lower respiratory specimens during the acute phase of infection. Negative results do not preclude SARS-CoV-2 infection, do not rule out co-infections with other pathogens, and should not be used as  the sole basis for treatment or other patient management decisions. Negative  results must be combined with clinical observations, patient history, and epidemiological information. The expected result is Negative. Fact Sheet for Patients: HairSlick.no Fact Sheet for Healthcare Providers: quierodirigir.com This test is not yet approved or cleared by the [place] FDA and  has been authorized for detection and/or diagnosis of SARS-CoV-2 by FDA under an Emergency Use Authorization (EUA). This EUA will remain  in effect (meaning this test can be used) for the duration of the COVID-19 declaration under Section 56 4(b)(1) of the Act, 21 U.S.C. section 360bbb-3(b)(1), unless the authorization is terminated or revoked sooner. Performed at [facility], [address] [place], [place] [address]   HIV Antibody (routine testing w rflx)     Status: None   Collection Time: 12/18/19  6:54 PM  Result Value Ref Range   HIV Screen 4th Generation wRfx NON REACTIVE NON REACTIVE    Comment: Performed at [facility], [address], [place], [place] [address]   CT HEAD WO CONTRAST  Result Date: 12/18/2019 CLINICAL DATA:  Pt reports that she woke up Monday morning with weakness and numbness to her L arm and L leg, saw her pcp this am who sent her here due to ataxia. Pts speech is clear, face symmetrical, equal grip strength bilaterally, decreased sensation to L arm, a/ox4, resp e/u, nad. EXAM: CT HEAD WITHOUT CONTRAST TECHNIQUE: Contiguous axial images were obtained from the base of the skull through the vertex without intravenous contrast. COMPARISON:  None. FINDINGS: Brain: No evidence of acute infarction, hemorrhage, hydrocephalus, extra-axial collection or mass lesion/mass effect. Patchy bilateral white matter hypoattenuation is noted consistent with moderate chronic microvascular ischemic change. Probable small old lacunar infarct noted  in the left centrum semiovale. Vascular: No hyperdense vessel or unexpected calcification. Skull: Normal. Negative for fracture or focal lesion. Sinuses/Orbits: Globes and orbits are unremarkable. Visualized sinuses and mastoid air cells are clear. Other: None. IMPRESSION: 1. No acute intracranial abnormalities. 2. Moderate chronic microvascular ischemic change. Probable old left centrum semiovale deep white matter lacunar infarct. Electronically Signed   By: Amie Portland M.D.   On: 12/18/2019 13:42   MR ANGIO HEAD WO CONTRAST  Result Date: 12/18/2019 CLINICAL DATA:  Stroke.  Left-sided weakness. EXAM: MRI HEAD WITHOUT CONTRAST MRA HEAD WITHOUT CONTRAST TECHNIQUE: Multiplanar, multiecho pulse sequences of the brain and surrounding structures were obtained without intravenous contrast. Angiographic images of the head were obtained using MRA technique without contrast. COMPARISON:  CT head 12/18/2019 FINDINGS: MRI HEAD FINDINGS Brain: Acute infarct right parietal deep white matter measuring approximately 10 x 15 mm. No other acute infarct. Moderate chronic microvascular ischemic changes throughout the white matter. Mild chronic ischemia in the pons and thalamus bilaterally. Negative for hemorrhage or mass. Ventricle size normal. Vascular: Normal arterial flow voids Skull and upper cervical spine: No focal skeletal lesion. Sinuses/Orbits: Negative Other: None MRA HEAD FINDINGS Right vertebral dominant and widely patent. Non dominant left vertebral artery with moderately severe stenosis distal to left PICA. Left PICA patent. Small right PICA patent. AICA and superior cerebellar arteries patent bilaterally. Fetal origin right posterior cerebral artery. Mild stenosis distal left posterior cerebral artery. Internal carotid artery patent bilaterally without stenosis or aneurysm. Anterior and middle cerebral arteries patent bilaterally. IMPRESSION: 1. Acute infarct in the right parietal deep white matter. 2. Moderate  chronic microvascular ischemic change 3. Moderate stenosis distal left vertebral artery. No intracranial large vessel occlusion. Electronically Signed   By: Marlan Palau M.D.   On: 12/18/2019 18:24  MR BRAIN WO CONTRAST  Result Date: 12/18/2019 CLINICAL DATA:  Stroke.  Left-sided weakness. EXAM: MRI HEAD WITHOUT CONTRAST MRA HEAD WITHOUT CONTRAST TECHNIQUE: Multiplanar, multiecho pulse sequences of the brain and surrounding structures were obtained without intravenous contrast. Angiographic images of the head were obtained using MRA technique without contrast. COMPARISON:  CT head 12/18/2019 FINDINGS: MRI HEAD FINDINGS Brain: Acute infarct right parietal deep white matter measuring approximately 10 x 15 mm. No other acute infarct. Moderate chronic microvascular ischemic changes throughout the white matter. Mild chronic ischemia in the pons and thalamus bilaterally. Negative for hemorrhage or mass. Ventricle size normal. Vascular: Normal arterial flow voids Skull and upper cervical spine: No focal skeletal lesion. Sinuses/Orbits: Negative Other: None MRA HEAD FINDINGS Right vertebral dominant and widely patent. Non dominant left vertebral artery with moderately severe stenosis distal to left PICA. Left PICA patent. Small right PICA patent. AICA and superior cerebellar arteries patent bilaterally. Fetal origin right posterior cerebral artery. Mild stenosis distal left posterior cerebral artery. Internal carotid artery patent bilaterally without stenosis or aneurysm. Anterior and middle cerebral arteries patent bilaterally. IMPRESSION: 1. Acute infarct in the right parietal deep white matter. 2. Moderate chronic microvascular ischemic change 3. Moderate stenosis distal left vertebral artery. No intracranial large vessel occlusion. Electronically Signed   By: Marlan Palau M.D.   On: 12/18/2019 18:24    Pending Labs Unresulted Labs (From admission, onward)    Start     Ordered   12/25/19 0500  Creatinine,  serum  (enoxaparin (LOVENOX)    CrCl >/= 30 ml/min)  Weekly,   R    Comments: while on enoxaparin therapy    12/18/19 2057   12/19/19 0500  Hemoglobin A1c  Tomorrow morning,   R     12/18/19 1843   12/19/19 0500  Lipid panel  Tomorrow morning,   R    Comments: Fasting    12/18/19 1843   12/19/19 0500  Basic metabolic panel  Tomorrow morning,   R     12/18/19 1843   12/19/19 0500  CBC  Tomorrow morning,   R     12/18/19 1843   12/19/19 0500  Magnesium  Tomorrow morning,   R     12/18/19 1843          Vitals/Pain Today's Vitals   12/18/19 1700 12/18/19 1851 12/18/19 1931 12/18/19 2054  BP: (!) 195/92 (!) 167/71 (!) 154/78 (!) 156/85  Pulse:  (!) 59 64 84  Resp: 13 19 17 16   Temp:      TempSrc:      SpO2:  98% 100% 97%  PainSc:        Isolation Precautions No active isolations  Medications Medications  vitamin B-12 (CYANOCOBALAMIN) tablet 100 mcg (has no administration in time range)  cholecalciferol (VITAMIN D3) tablet 1,000 Units (has no administration in time range)   stroke: mapping our early stages of recovery book (has no administration in time range)  0.9 %  sodium chloride infusion ( Intravenous New Bag/Given 12/18/19 1857)  acetaminophen (TYLENOL) tablet 650 mg (has no administration in time range)    Or  acetaminophen (TYLENOL) 160 MG/5ML solution 650 mg (has no administration in time range)    Or  acetaminophen (TYLENOL) suppository 650 mg (has no administration in time range)  enoxaparin (LOVENOX) injection 40 mg (40 mg Subcutaneous Given 12/18/19 2002)  aspirin EC tablet 81 mg (has no administration in time range)  clopidogrel (PLAVIX) tablet 75 mg (has no administration in time  range)  LORazepam (ATIVAN) injection 1 mg (1 mg Intravenous Given 12/18/19 1730)    Mobility walks with device Low fall risk   Focused Assessments Neuro Assessment Handoff:  Swallow screen pass? Yes  Cardiac Rhythm: Normal sinus rhythm NIH Stroke Scale ( + Modified Stroke  Scale Criteria)  LOC Questions (1b. )   +: Answers both questions correctly LOC Commands (1c. )   + : Performs both tasks correctly Best Gaze (2. )  +: Normal Visual (3. )  +: No visual loss Motor Arm, Left (5a. )   +: No drift Motor Arm, Right (5b. )   +: No drift Motor Leg, Left (6a. )   +: Drift Motor Leg, Right (6b. )   +: No drift Sensory (8. )   +: Normal, no sensory loss Best Language (9. )   +: No aphasia Extinction/Inattention (11.)   +: No Abnormality Modified SS Total  +: 1     Neuro Assessment: Exceptions to WDL Neuro Checks:      Last Documented NIHSS Modified Score: 1 (12/18/19 1855) Has TPA been given? No If patient is a Neuro Trauma and patient is going to OR before floor call report to 4N Charge nurse: [phone] or [phone]     R Recommendations: See Admitting Provider Note  Report given to:   Additional Notes:

## 2019-12-18 NOTE — ED Notes (Signed)
CBC and creatinine already drawn. Discontinued 2100 draw per order details. Still to be drawn regularly with 0500 labs.

## 2019-12-18 NOTE — ED Notes (Signed)
Dr. Tyson Babinski paged to [phone]-per Percival Spanish, RN paged by Marylene Land

## 2019-12-18 NOTE — ED Notes (Signed)
Patient transported to CT 

## 2019-12-18 NOTE — H&P (Signed)
[facility] History and Physical  [name] DOB: July 03, 1951 DOA: 12/18/2019  Referring physician: ED  PCP: Dione Housekeeper, MD   Chief Complaint: Left-sided weakness  HPI: [name] is a 69 y.o. female with no significant past medical history for arthritis and GERD who presented to the hospital with weakness and numbness of her left side of her body.  Patient woke up on Monday morning (12/16/19) with a left-sided weakness and numbness to her left arm and leg.  Patient thought it might be secondary to the way she was sleeping but she noticed that that she was dropping things from her left hand and had difficulty walking and weakness in her left leg.  Patient did fall while she was trying to sit in her chair today but denied any trauma from the fall.  Patient then went to see her primary care physician today who sent her to the hospital for this finding including ataxia.  Patient denied any chest pain, palpitation, dizziness or lightheadedness secondary to fall but simply did not feel at her normal.  Patient denies history of hypertension in the past.  Denies urinary urgency, frequency or dysuria.  Patient denies any history of atrial fibrillation or cardiac problems in the past.  Patient reports that she did have full PCP visit November and has been doing well.  Denies any exposure to Covid.  No fever, cough, chills or rigor.  No chest pain palpitation or shortness of breath.  ED Course: In the ED, patient did have mild left upper extremity weakness with handgrip and mild left lower extremity weakness.  Patient did have elevated blood pressure in the ED.  Neurology was notified from the ED.  Since the patient was out of the TPA window, patient was then considered for admission to the hospital for stroke work-up.  Review of Systems:  All systems were reviewed and were negative unless otherwise mentioned in the HPI  Past Medical History:  Diagnosis Date  .  Arthritis   . GERD (gastroesophageal reflux disease)    Past Surgical History:  Procedure Laterality Date  . NO PAST SURGERIES    . 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 She does not have any smokeless tobacco history on file. She reports current alcohol use. She reports that she does not use drugs.  Allergies  Allergen Reactions  . Glucosamine Anaphylaxis, Shortness Of Breath and Swelling  . Iodine Anaphylaxis, Shortness Of Breath and Swelling  . Shellfish-Derived Products Anaphylaxis, Shortness Of Breath and Swelling    History reviewed. No pertinent family history.   Prior to Admission medications   Medication Sig Start Date End Date Taking? Authorizing Provider  amoxicillin (AMOXIL) 500 MG tablet Take 2,000 mg by mouth See admin instructions. Take 2,000 mg by mouth one hour prior to dental appointments/procedures- as directed 09/30/19  Yes [provider]  Cholecalciferol (VITAMIN D-3) 25 MCG (1000 UT) CAPS Take 1,000 Units by mouth daily with breakfast.    Yes [provider]  Cyanocobalamin (VITAMIN B-12 PO) Take 1 tablet by mouth daily with breakfast.    Yes [provider]  Multiple Vitamins-Minerals (ONE-A-DAY WOMENS 50+ ADVANTAGE) TABS Take 1 tablet by mouth daily with breakfast.   Yes [provider]  celecoxib (CELEBREX) 200 MG capsule Take 1 capsule (  200 mg total) by mouth 2 (two) times daily. Patient not taking: Reported on 12/18/2019 05/27/13   Clarene Critchley, PA-C  docusate sodium 100 MG CAPS Take 100 mg by mouth 2 (two) times daily. Patient not taking: Reported on 12/18/2019 05/27/13   Clarene Critchley, PA-C  enoxaparin (LOVENOX) 40 MG/0.4ML injection Inject 0.4 mLs (40 mg total) into the skin daily. Patient not taking: Reported on 12/18/2019  05/27/13   Clarene Critchley, PA-C  HYDROcodone-acetaminophen [facility]) 10-325 MG per tablet Take 1 tablet by mouth every 6 (six) hours as needed for pain. Patient not taking: Reported on 12/18/2019 05/27/13   Clarene Critchley, PA-C    Physical Exam: Vitals:   12/18/19 1430 12/18/19 1500 12/18/19 1530 12/18/19 1600  BP: (!) 174/83 (!) 187/89  (!) 167/88  Pulse: 74 69 71   Resp: 18 16 (!) 25 20  Temp:      TempSrc:      SpO2: 99% 100% 100%    Wt Readings from Last 3 Encounters:  05/14/13 111.6 kg   There is no height or weight on file to calculate BMI. There is no height or weight on file to calculate BMI.   General: Obese, not in obvious distress HENT: Normocephalic, pupils equally reacting to light and accommodation.  No scleral pallor or icterus noted. Oral mucosa is moist.  Chest:  Clear breath sounds.  Diminished breath sounds bilaterally. No crackles or wheezes.  CVS: S1 &S2 heard. No murmur.  Regular rate and rhythm. Abdomen: Soft, nontender, nondistended.  Bowel sounds are heard.  Liver is not palpable, no abdominal mass palpated Extremities: No cyanosis, clubbing or edema.  Peripheral pulses are palpable.  Left upper extremity weakness with decreased grip strength.  Mild weakness of the left lower extremity. Psych: Alert, awake and oriented, normal mood CNS:  No cranial nerve deficits.  Left upper and lower extremity mild weakness.  Skin: Warm and dry.  No rashes noted.  Labs on Admission:   CBC: Recent Labs  Lab 12/18/19 1115 12/18/19 1135  WBC 5.4  --   NEUTROABS 3.7  --   HGB 12.7 14.3  HCT 42.2 42.0  MCV 87.7  --   PLT 269  --     Basic Metabolic Panel: Recent Labs  Lab 12/18/19 1115 12/18/19 1135  NA 141 142  K 3.6 3.6  CL 106 108  CO2 25  --   GLUCOSE 123* 120*  BUN 14 18  CREATININE 0.64 0.60  CALCIUM 9.5  --     Liver Function Tests: Recent Labs  Lab 12/18/19 1115  AST 25  ALT 20  ALKPHOS 68  BILITOT 0.9  PROT 7.9  ALBUMIN 4.2   No  results for input(s): LIPASE, AMYLASE in the last 168 hours. No results for input(s): AMMONIA in the last 168 hours.  Cardiac Enzymes: No results for input(s): CKTOTAL, CKMB, CKMBINDEX, TROPONINI in the last 168 hours.  BNP (last 3 results) No results for input(s): BNP in the last 8760 hours.  ProBNP (last 3 results) No results for input(s): PROBNP in the last 8760 hours.  CBG: No results for input(s): GLUCAP in the last 168 hours.  Lipase  No results found for: LIPASE   Urinalysis    Component Value Date/Time   COLORURINE YELLOW 05/14/2013 1051   APPEARANCEUR CLEAR 05/14/2013 1051   LABSPEC 1.026 05/14/2013 1051   PHURINE 6.0 05/14/2013 1051   GLUCOSEU NEGATIVE 05/14/2013 1051   HGBUR NEGATIVE 05/14/2013 1051  BILIRUBINUR NEGATIVE 05/14/2013 1051   KETONESUR NEGATIVE 05/14/2013 1051   PROTEINUR NEGATIVE 05/14/2013 1051   UROBILINOGEN 1.0 05/14/2013 1051   NITRITE NEGATIVE 05/14/2013 1051   LEUKOCYTESUR NEGATIVE 05/14/2013 1051     Drugs of Abuse  No results found for: LABOPIA, COCAINSCRNUR, LABBENZ, AMPHETMU, THCU, LABBARB    Radiological Exams on Admission: CT HEAD WO CONTRAST  Result Date: 12/18/2019 CLINICAL DATA:  Pt reports that she woke up Monday morning with weakness and numbness to her L arm and L leg, saw her pcp this am who sent her here due to ataxia. Pts speech is clear, face symmetrical, equal grip strength bilaterally, decreased sensation to L arm, a/ox4, resp e/u, nad. EXAM: CT HEAD WITHOUT CONTRAST TECHNIQUE: Contiguous axial images were obtained from the base of the skull through the vertex without intravenous contrast. COMPARISON:  None. FINDINGS: Brain: No evidence of acute infarction, hemorrhage, hydrocephalus, extra-axial collection or mass lesion/mass effect. Patchy bilateral white matter hypoattenuation is noted consistent with moderate chronic microvascular ischemic change. Probable small old lacunar infarct noted in the left centrum semiovale.  Vascular: No hyperdense vessel or unexpected calcification. Skull: Normal. Negative for fracture or focal lesion. Sinuses/Orbits: Globes and orbits are unremarkable. Visualized sinuses and mastoid air cells are clear. Other: None. IMPRESSION: 1. No acute intracranial abnormalities. 2. Moderate chronic microvascular ischemic change. Probable old left centrum semiovale deep white matter lacunar infarct. Electronically Signed   By: Amie Portland M.D.   On: 12/18/2019 13:42    EKG: Personally reviewed by me which shows normal sinus rhythm.  Assessment/Plan Principal Problem:   Acute CVA (cerebrovascular accident) (HCC)  Left-sided weakness and numbness likely secondary to acute ischemic CVA.  CT head scan in the ED was negative for acute findings.  Patient is out of the TPA window for any intervention.  Neurology has been consulted.  Will follow recommendations.  Patient will undergo MRI of the head and neck.  Check a fasting lipid profile, hemoglobin A1c.  Neurochecks.  Blood pressure was elevated in the ED and will consider  antihypertensive at this time since patient is out of the window for permissive hypertension.  Will get PT OT speech evaluation.  Follow neurology recommendation.  Aspirin suppository for now.  History of GERD.    History of arthritis.  Consider Tylenol for pain if needed.  Hold off with celecoxib.   DVT Prophylaxis: Lovenox  Consultant: Neurology  Code Status: Full code  Microbiology none  Antibiotics: None  Family Communication:  Patients' condition and plan of care including tests being ordered have been discussed with the patient  who indicate understanding and agree with the plan.  Disposition Plan: Likely home with home health.  PT OT pending.  Pending stroke work-up.  Severity of Illness: The appropriate patient status for this patient is INPATIENT. Inpatient status is judged to be reasonable and necessary in order to provide the required intensity of service  to ensure the patient's safety. The patient's presenting symptoms, physical exam findings, and initial radiographic and laboratory data in the context of their chronic comorbidities is felt to place them at high risk for further clinical deterioration. Furthermore, it is not anticipated that the patient will be medically stable for discharge from the hospital within 2 midnights of admission. The following factors support the patient status of inpatient. I certify that at the point of admission it is my clinical judgment that the patient will require inpatient hospital care spanning beyond 2 midnights from the point of admission due  to high intensity of service, high risk for further deterioration and high frequency of surveillance required.   Signed, Joycelyn Das, MD [facility] 12/18/2019

## 2019-12-18 NOTE — Consult Note (Addendum)
Neurology Consultation  Reason for Consult: Stroke Referring Physician: Pokhrel  CC: left sided weakness  History is obtained from: Patient  HPI: [name] is a 69 y.o. female with history of GERD, arthritis.  Patient arrived to the hospital for left-sided weakness which has been present for 3 days.  Neurology was consulted for stroke.    Patient went to sleep on Sunday night at approximately 0000 hours.  She states that she woke in the morning and noted that her left arm felt heavy, left leg felt heavy.  She was not sure what was going on, possibly slept on her left side wrong.  For the rest the day on Monday she states she "did nothing".  She went to sleep Monday night.  She did call her PCP on Tuesday but was unable to see them till today.  Upon seeing them today she was told to come to the emergency department.   ED course  Labs-to evaluate for infection or electrolyte abnormality CT head to evaluate for Bleed, mass or stroke  LKW: 000 0 hours on 12/15/2019 tpa given?: no, window Premorbid modified Rankin scale (mRS):0 NIH Score: 1   Past Medical History:  Diagnosis Date  . Arthritis   . GERD (gastroesophageal reflux disease)     History reviewed. No pertinent family history.   Social History:   reports that she has never smoked. She does not have any smokeless tobacco history on file. She reports current alcohol use. She reports that she does not use drugs.  Medications  Current Facility-Administered Medications:  .  LORazepam (ATIVAN) injection 1 mg, 1 mg, Intravenous, Once, Plunkett, Whitney, MD .  sodium chloride flush (NS) 0.9 % injection 3 mL, 3 mL, Intravenous, Once, Plunkett, Whitney, MD  Current Outpatient Medications:  .  amoxicillin (AMOXIL) 500 MG tablet, Take 2,000 mg by mouth See admin instructions. Take 2,000 mg by mouth one hour prior to dental appointments/procedures- as directed, Disp: , Rfl:  .  Cholecalciferol (VITAMIN D-3) 25 MCG (1000 UT)  CAPS, Take 1,000 Units by mouth daily with breakfast. , Disp: , Rfl:  .  Cyanocobalamin (VITAMIN B-12 PO), Take 1 tablet by mouth daily with breakfast. , Disp: , Rfl:  .  Multiple Vitamins-Minerals (ONE-A-DAY WOMENS 50+ ADVANTAGE) TABS, Take 1 tablet by mouth daily with breakfast., Disp: , Rfl:  .  celecoxib (CELEBREX) 200 MG capsule, Take 1 capsule (200 mg total) by mouth 2 (two) times daily. (Patient not taking: Reported on 12/18/2019), Disp: 60 capsule, Rfl: 0 .  docusate sodium 100 MG CAPS, Take 100 mg by mouth 2 (two) times daily. (Patient not taking: Reported on 12/18/2019), Disp: 10 capsule, Rfl: 0 .  enoxaparin (LOVENOX) 40 MG/0.4ML injection, Inject 0.4 mLs (40 mg total) into the skin daily. (Patient not taking: Reported on 12/18/2019), Disp: 5 Syringe, Rfl: 0 .  HYDROcodone-acetaminophen (NORCO) 10-325 MG per tablet, Take 1 tablet by mouth every 6 (six) hours as needed for pain. (Patient not taking: Reported on 12/18/2019), Disp: 60 tablet, Rfl: 0  ROS:  *  General ROS: negative for - chills, fatigue, fever, night sweats, weight gain or weight loss Psychological ROS: negative for - behavioral disorder, hallucinations, memory difficulties, mood swings or suicidal ideation Ophthalmic ROS: negative for - blurry vision, double vision, eye pain or loss of vision ENT ROS: negative for - epistaxis, nasal discharge, oral lesions, sore throat, tinnitus or vertigo Allergy and Immunology ROS: negative for - hives or itchy/watery eyes Hematological and Lymphatic ROS: negative  for - bleeding problems, bruising or swollen lymph nodes Endocrine ROS: negative for - galactorrhea, hair pattern changes, polydipsia/polyuria or temperature intolerance Respiratory ROS: negative for - cough, hemoptysis, shortness of breath or wheezing Cardiovascular ROS: negative for - chest pain, dyspnea on exertion, edema or irregular heartbeat Gastrointestinal ROS: negative for - abdominal pain, diarrhea, hematemesis,  nausea/vomiting or stool incontinence Genito-Urinary ROS: negative for - dysuria, hematuria, incontinence or urinary frequency/urgency Musculoskeletal ROS: Positive for -  muscular weakness Neurological ROS: as noted in HPI Dermatological ROS: negative for rash and skin lesion changes  Exam: Current vital signs: BP (!) 167/88   Pulse 71   Temp 98.1 F (36.7 C) (Oral)   Resp 20   SpO2 100%  Vital signs in last 24 hours: Temp:  [98.1 F (36.7 C)] 98.1 F (36.7 C) (01/13 1109) Pulse Rate:  [56-74] 71 (01/13 1530) Resp:  [15-25] 20 (01/13 1600) BP: (162-201)/(71-104) 167/88 (01/13 1600) SpO2:  [96 %-100 %] 100 % (01/13 1530)   Constitutional: Appears well-developed and well-nourished.  Psych: Affect appropriate to situation Eyes: No scleral injection HENT: No OP obstrucion Head: Normocephalic.  Cardiovascular: Normal rate and regular rhythm.  Respiratory: Effort normal, non-labored breathing GI: Soft.  No distension. There is no tenderness.  Skin: WDI  Neuro: Mental Status: Patient is awake, alert, oriented to person, place, month, year, and situation. Speech-no problems with naming, repeating, comprehension Patient is able to give a clear and coherent history.  Cranial Nerves: II: Visual Fields are full.  III,IV, VI: EOMI without ptosis or diploplia. Pupils equal, round and reactive to light V: Facial sensation is symmetric to temperature VII: Facial movement is symmetric.  VIII: hearing is intact to voice X: Palat elevates symmetrically XI: Shoulder shrug is symmetric. XII: tongue is midline without atrophy or fasciculations.  Motor: Patient has 5 out of 5 strength in both right upper extremity and right lower extremity, 4/ 5 strength with pronator drift in the left upper extremity and 4/ 5 strength in the left lower extremity. Sensory: Sensation is reduced in the left arm, symmetric to light touch and temperature in the left leg DSS Deep Tendon Reflexes: 2+ and  symmetric in the biceps and patellae.  Plantars: Toes are downgoing bilaterally.  Cerebellar: FNF is normal in the right upper extremity, not out of proportion to weakness in the left upper extremity  Labs I have reviewed labs in epic and the results pertinent to this consultation are:   CBC    Component Value Date/Time   WBC 5.4 12/18/2019 1115   RBC 4.81 12/18/2019 1115   HGB 14.3 12/18/2019 1135   HCT 42.0 12/18/2019 1135   PLT 269 12/18/2019 1115   MCV 87.7 12/18/2019 1115   MCH 26.4 12/18/2019 1115   MCHC 30.1 12/18/2019 1115   RDW 13.4 12/18/2019 1115   LYMPHSABS 1.3 12/18/2019 1115   MONOABS 0.4 12/18/2019 1115   EOSABS 0.0 12/18/2019 1115   BASOSABS 0.0 12/18/2019 1115    CMP     Component Value Date/Time   NA 142 12/18/2019 1135   K 3.6 12/18/2019 1135   CL 108 12/18/2019 1135   CO2 25 12/18/2019 1115   GLUCOSE 120 (H) 12/18/2019 1135   BUN 18 12/18/2019 1135   CREATININE 0.60 12/18/2019 1135   CALCIUM 9.5 12/18/2019 1115   PROT 7.9 12/18/2019 1115   ALBUMIN 4.2 12/18/2019 1115   AST 25 12/18/2019 1115   ALT 20 12/18/2019 1115   ALKPHOS 68 12/18/2019 1115  BILITOT 0.9 12/18/2019 1115   GFRNONAA >60 12/18/2019 1115   GFRAA >60 12/18/2019 1115    Lipid Panel  No results found for: CHOL, TRIG, HDL, CHOLHDL, VLDL, LDLCALC, LDLDIRECT   Imaging I have reviewed the images obtained:  CT-scan of the brain: No acute abnormalities, moderate chronic microvascular disease  MRI examination of the brain: Acute infarct in the right centrum semiovale/right parietal deep white matter.  Etta Quill PA-C [facility] [phone]  M-F  (9:00 am- 5:00 PM)  12/18/2019, 5:11 PM    NEUROHOSPITALIST ADDENDUM Performed a face to face diagnostic evaluation.   I have reviewed the contents of history and physical exam as documented by PA/ARNP/Resident and agree with above documentation.  I have discussed and formulated the above plan as documented. Edits  to the note have been made as needed.  69 year old female with history of obesity, arthritis presents to the emergency department with 3-day history of left-sided weakness.  Patient woke up on Monday and noticed that her left arm and leg was weak.  She initially thought she may have slept on it, however symptoms persisted and she called her PCP who asked her to come in today.  Her PCP directed her to present to the emergency department due to concern for stroke.  On arrival, patient underwent stat head CT which was unremarkable for acute findings.  Not a candidate for TPA as she was outside the TPA 24-hour window.  She was evaluated by EDP and admitted to medicine for stroke work-up and neurology was consulted for further recommendations.  MRI brain shows a acute infarct in the right parietal deep white matter, likely small vessel disease.  Patient does not have known hypertension or diabetes, however blood pressure was elevated on arrival at 189/90 mmHg.  Other risk factors include obesity.  A1c and lipid profile is pending.  Patient does not smoke.  She does not take aspirin or statin at home.   Acute Ischemic Stroke   Recommendations # MRI of the brain without contrast; completed #MRA Head and neck : completed # carotid doppler  #Transthoracic Echo  # Start patient on ASA 81mg  daily and plavix 75 mg daily #Start or continue Atorvastatin 80 mg/other high intensity statin # BP goal: permissive HTN upto 185/110 mmHg with gradual return to normotension # HBAIC and Lipid profile # Telemetry monitoring # Frequent neuro checks #  stroke swallow screen  Please page stroke NP  Or  PA  Or MD from 8am -4 pm  as this patient from this time will be  followed by the stroke.   You can look them up on [email or web address]  Password [facility]     Karena Addison Hektor Huston MD [facility] [phone]   If 7pm to 7am, please call on call as listed on AMION.  Positive

## 2019-12-18 NOTE — ED Notes (Signed)
Patient transported to MRI 

## 2019-12-18 NOTE — ED Notes (Signed)
Pt declined to have anyone called at this time 

## 2019-12-18 NOTE — ED Provider Notes (Signed)
[facility] EMERGENCY DEPARTMENT Provider Note   CSN: [id number] Arrival date & time: 12/18/19  1101     History Chief Complaint  Patient presents with  . Extremity Weakness    [name] is a 69 y.o. female.  Patient is a 69 year old female with no significant past medical history who presents today with weakness and numbness in her left arm and leg that started on Monday when she woke up.  Patient thought it was just the way she slept and that it would go away with time but has not gotten any better.  When she tries to hold something in her left hand she is dropping it and is even having some difficulty walking because of feeling weak in the left leg.  To the point where she fell today when she was trying to sit in a chair.  She denies injuring herself from the fall but states the weakness and numbness persist.  She has not had headache, visual changes or speech difficulty.  No prior history of similar symptoms.  Patient states she saw her doctor for a full checkup in November and was doing well.  She has never had high blood pressure and takes vitamins.  She denies any chest pain, shortness of breath or abdominal pain.  She has not had Covid.  The history is provided by the patient.  Extremity Weakness This is a new problem. The current episode started 2 days ago. The problem occurs constantly. The problem has not changed since onset.Associated symptoms comments: Left arm and leg weakness and numbness. The symptoms are aggravated by walking. Nothing relieves the symptoms. She has tried nothing for the symptoms. The treatment provided no relief.       Past Medical History:  Diagnosis Date  . Arthritis   . GERD (gastroesophageal reflux disease)     Patient Active Problem List   Diagnosis Date Noted  . Allergy or intolerance to drug 05/27/2013  . Postoperative anemia due to acute blood loss 05/27/2013  . Osteoarthritis of right hip 05/24/2013    Past  Surgical History:  Procedure Laterality Date  . NO PAST SURGERIES    . TOTAL HIP ARTHROPLASTY Right 05/22/2013   Procedure: TOTAL HIP ARTHROPLASTY- right ;  Surgeon: Loreta Ave, MD;  Location: [facility] OR;  Service: Orthopedics;  Laterality: Right;     OB History   No obstetric history on file.     No family history on file.  Social History   Tobacco Use  . Smoking status: Never Smoker  Substance Use Topics  . Alcohol use: Yes    Comment: occ  . Drug use: No    Home Medications Prior to Admission medications   Medication Sig Start Date End Date Taking? Authorizing Provider  celecoxib (CELEBREX) 200 MG capsule Take 1 capsule (200 mg total) by mouth 2 (two) times daily. 05/27/13   Clarene Critchley, PA-C  docusate sodium 100 MG CAPS Take 100 mg by mouth 2 (two) times daily. 05/27/13   Clarene Critchley, PA-C  enoxaparin (LOVENOX) 40 MG/0.4ML injection Inject 0.4 mLs (40 mg total) into the skin daily. 05/27/13   Clarene Critchley, PA-C  HYDROcodone-acetaminophen (NORCO) 10-325 MG per tablet Take 1 tablet by mouth every 6 (six) hours as needed for pain. 05/27/13   Clarene Critchley, PA-C  omeprazole (PRILOSEC) 20 MG capsule Take 20 mg by mouth daily.    [provider]    Allergies    Shellfish-derived products  Review of Systems   Review of Systems  Musculoskeletal: Positive for extremity weakness.  All other systems reviewed and are negative.   Physical Exam Updated Vital Signs BP (!) 201/104 (BP Location: Right Arm)   Pulse 73   Temp 98.1 F (36.7 C) (Oral)   Resp 18   SpO2 99%   Physical Exam Vitals and nursing note reviewed.  Constitutional:      General: She is not in acute distress.    Appearance: She is well-developed. She is obese.  HENT:     Head: Normocephalic and atraumatic.  Eyes:     General: No visual field deficit.    Pupils: Pupils are equal, round, and reactive to light.  Cardiovascular:     Rate and Rhythm: Normal rate and regular rhythm.      Heart sounds: Normal heart sounds. No murmur. No friction rub.  Pulmonary:     Effort: Pulmonary effort is normal.     Breath sounds: Normal breath sounds. No wheezing or rales.  Abdominal:     General: Bowel sounds are normal. There is no distension.     Palpations: Abdomen is soft.     Tenderness: There is no abdominal tenderness. There is no guarding or rebound.  Musculoskeletal:        General: No tenderness. Normal range of motion.     Comments: No edema  Skin:    General: Skin is warm and dry.     Findings: No rash.  Neurological:     Mental Status: She is alert and oriented to person, place, and time.     Cranial Nerves: No cranial nerve deficit, dysarthria or facial asymmetry.     Sensory: Sensory deficit present.     Motor: Weakness and pronator drift present.     Coordination: Coordination is intact.     Comments: Pronator drift present in the left upper and lower extremity.  4 out of 5 strength in the left upper and lower extremity.  Mild decreased sensation to light touch in the left upper and lower extremity.  Sensation intact on the face with normal smile and eyebrow raise.  No visual field cuts.  Speech is clear without aphasia.  Psychiatric:        Mood and Affect: Mood normal.        Behavior: Behavior normal.        Thought Content: Thought content normal.     ED Results / Procedures / Treatments   Labs (all labs ordered are listed, but only abnormal results are displayed) Labs Reviewed  COMPREHENSIVE METABOLIC PANEL - Abnormal; Notable for the following components:      Result Value   Glucose, Bld 123 (*)    All other components within normal limits  I-STAT CHEM 8, ED - Abnormal; Notable for the following components:   Glucose, Bld 120 (*)    Calcium, Ion 1.14 (*)    All other components within normal limits  SARS CORONAVIRUS 2 (TAT 6-24 HRS)  PROTIME-INR  APTT  CBC  DIFFERENTIAL  CBG MONITORING, ED    EKG EKG Interpretation  Date/Time:  Wednesday  December 18 2019 11:11:12 EST Ventricular Rate:  74 PR Interval:  164 QRS Duration: 82 QT Interval:  378 QTC Calculation: 419 R Axis:   70 Text Interpretation: Normal sinus rhythm Normal ECG No significant change since last tracing Confirmed by Gwyneth Sprout ([id number]) on 12/18/2019 11:53:36 AM   Radiology CT HEAD WO CONTRAST  Result Date: 12/18/2019 CLINICAL DATA:  Pt reports that she woke up Monday morning with weakness and numbness to her L arm and L leg, saw her pcp this am who sent her here due to ataxia. Pts speech is clear, face symmetrical, equal grip strength bilaterally, decreased sensation to L arm, a/ox4, resp e/u, nad. EXAM: CT HEAD WITHOUT CONTRAST TECHNIQUE: Contiguous axial images were obtained from the base of the skull through the vertex without intravenous contrast. COMPARISON:  None. FINDINGS: Brain: No evidence of acute infarction, hemorrhage, hydrocephalus, extra-axial collection or mass lesion/mass effect. Patchy bilateral white matter hypoattenuation is noted consistent with moderate chronic microvascular ischemic change. Probable small old lacunar infarct noted in the left centrum semiovale. Vascular: No hyperdense vessel or unexpected calcification. Skull: Normal. Negative for fracture or focal lesion. Sinuses/Orbits: Globes and orbits are unremarkable. Visualized sinuses and mastoid air cells are clear. Other: None. IMPRESSION: 1. No acute intracranial abnormalities. 2. Moderate chronic microvascular ischemic change. Probable old left centrum semiovale deep white matter lacunar infarct. Electronically Signed   By: Lajean Manes M.D.   On: 12/18/2019 13:42    Procedures Procedures (including critical care time)  Medications Ordered in ED Medications  sodium chloride flush (NS) 0.9 % injection 3 mL (has no administration in time range)    ED Course  I have reviewed the triage vital signs and the nursing notes.  Pertinent labs & imaging results that were available  during my care of the patient were reviewed by me and considered in my medical decision making (see chart for details).    MDM Rules/Calculators/A&P                      69 year old female presenting today with left-sided weakness and decreased sensation concerning for stroke.  Symptoms occurred on Monday when she woke up so she is outside of any TPA window due to length of symptoms.  Patient is hypertensive today without prior history of hypertension.  She has no prior history of atrial fibrillation but states occasionally she will have episodes of just feeling tired for no reason currently she is not in atrial fibrillation.  She has no history of diabetes, hyperlipidemia or other risk factors for stroke.  Concern possible PFO or paroxysmal A. fib that has not been detected.  EKG today is normal, PT, PTT, CBC, CMP are within normal limits.  Head CT pending.  2:53 PM Patient's head CT is negative for acute abnormalities but does have moderate right chronic microvascular ischemic changes.  Due to patient's ongoing symptoms and exam discussed with Dr. Dala Dock who agrees that patient needs admission for stroke work-up.  He recommended MR of the brain and MRA of head.  Will admit for further care.  Final Clinical Impression(s) / ED Diagnoses Final diagnoses:  Cerebrovascular accident (CVA), unspecified mechanism ([place])    Rx / DC Orders ED Discharge Orders    None       Blanchie Dessert, MD 12/18/19 1454

## 2019-12-18 NOTE — ED Notes (Signed)
Pt ambulated self efficiently to restroom with no difficulty. Pt returned safely to bed. 

## 2019-12-19 ENCOUNTER — Encounter (HOSPITAL_COMMUNITY): Payer: Self-pay | Admitting: Internal Medicine

## 2019-12-19 ENCOUNTER — Inpatient Hospital Stay (HOSPITAL_COMMUNITY): Payer: Medicare Other

## 2019-12-19 DIAGNOSIS — I639 Cerebral infarction, unspecified: Secondary | ICD-10-CM

## 2019-12-19 DIAGNOSIS — I361 Nonrheumatic tricuspid (valve) insufficiency: Secondary | ICD-10-CM

## 2019-12-19 LAB — CBC
HCT: 37.4 % (ref 36.0–46.0)
Hemoglobin: 11.7 g/dL — ABNORMAL LOW (ref 12.0–15.0)
MCH: 26.6 pg (ref 26.0–34.0)
MCHC: 31.3 g/dL (ref 30.0–36.0)
MCV: 85 fL (ref 80.0–100.0)
Platelets: 236 10*3/uL (ref 150–400)
RBC: 4.4 MIL/uL (ref 3.87–5.11)
RDW: 13.2 % (ref 11.5–15.5)
WBC: 5.3 10*3/uL (ref 4.0–10.5)
nRBC: 0 % (ref 0.0–0.2)

## 2019-12-19 LAB — LIPID PANEL
Cholesterol: 187 mg/dL (ref 0–200)
HDL: 55 mg/dL (ref >40)
LDL Cholesterol: 118 mg/dL — ABNORMAL HIGH (ref 0–99)
Total CHOL/HDL Ratio: 3.4 RATIO
Triglycerides: 70 mg/dL (ref <150)
VLDL: 14 mg/dL (ref 0–40)

## 2019-12-19 LAB — BASIC METABOLIC PANEL
Anion gap: 12 (ref 5–15)
BUN: 17 mg/dL (ref 8–23)
CO2: 22 mmol/L (ref 22–32)
Calcium: 8.8 mg/dL — ABNORMAL LOW (ref 8.9–10.3)
Chloride: 107 mmol/L (ref 98–111)
Creatinine, Ser: 0.62 mg/dL (ref 0.44–1.00)
GFR calc Af Amer: >60 mL/min (ref >60)
GFR calc non Af Amer: >60 mL/min (ref >60)
Glucose, Bld: 119 mg/dL — ABNORMAL HIGH (ref 70–99)
Potassium: 3.6 mmol/L (ref 3.5–5.1)
Sodium: 141 mmol/L (ref 135–145)

## 2019-12-19 LAB — ECHOCARDIOGRAM COMPLETE
Height: 66 in
Weight: 3770.75 oz

## 2019-12-19 LAB — HEMOGLOBIN A1C
Hgb A1c MFr Bld: 6.1 % — ABNORMAL HIGH (ref 4.8–5.6)
Mean Plasma Glucose: 128.37 mg/dL

## 2019-12-19 LAB — MAGNESIUM: Magnesium: 1.9 mg/dL (ref 1.7–2.4)

## 2019-12-19 MED ORDER — ATORVASTATIN CALCIUM 40 MG PO TABS
40.0000 mg | ORAL_TABLET | Freq: Every day | ORAL | Status: DC
  Administered 2019-12-19: 40 mg via ORAL
  Filled 2019-12-19: qty 1

## 2019-12-19 NOTE — Progress Notes (Signed)
  Echocardiogram 2D Echocardiogram has been performed.  [name] 12/19/2019, 2:10 PM

## 2019-12-19 NOTE — Progress Notes (Signed)
Carotid duplex exam completed.

## 2019-12-19 NOTE — Progress Notes (Addendum)
PROGRESS NOTE  [name] AUQ:[id number] DOB: Oct 22, 1951 DOA: 12/18/2019 PCP: Dione Housekeeper, MD   LOS: 1 day   Brief narrative: As per HPI,  [name] is a 69 y.o. female with no significant past medical history for arthritis and GERD who presented to the hospital with weakness and numbness of her left side of her body.  Patient woke up on Monday morning (12/16/19) with a left-sided weakness and numbness to her left arm and leg.  Patient thought it might be secondary to the way she was sleeping but she noticed that that she was dropping things from her left hand and had difficulty walking and weakness in her left leg.  Patient did fall while she was trying to sit in her chair today but denied any trauma from the fall.  Patient then went to see her primary care physician today who sent her to the hospital for this finding including ataxia.  Patient denied any chest pain, palpitation, dizziness or lightheadedness secondary to fall but simply did not feel at her normal.  Patient denies history of hypertension in the past.  Denies urinary urgency, frequency or dysuria.  Patient denies any history of atrial fibrillation or cardiac problems in the past.  Patient reports that she did have full PCP visit November and has been doing well.  Denies any exposure to Covid.  No fever, cough, chills or rigor.  No chest pain palpitation or shortness of breath.  ED Course: In the ED, patient did have mild left upper extremity weakness with handgrip and mild left lower extremity weakness.  Patient did have elevated blood pressure in the ED.  Neurology was notified from the ED.  Since the patient was out of the TPA window, patient was then considered for admission to the hospital for stroke work-up.  Assessment/Plan:  Principal Problem:   Acute CVA (cerebrovascular accident) ([place])  Left-sided weakness and numbness secondary to acute infarct in the right parietal deep white matter. CT head scan in the  ED was negative for acute findings.  Patient is out of the TPA window for any intervention.  Neurology on board.  MRI with acute infarct in the right parietal deep white matter.  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 Fasting lipid profile with LDL at 118. Hemoglobin A1c at 6.1.  Blood pressure was elevated in the ED and will consider  antihypertensive on discharge.   Patient passed a bedside swallow evaluation.   Has been started on dual antiplatelets, high-dose statins by neurology.  Physical therapy has seen the patient today and recommend home health PT and OT on discharge.  Check carotid ultrasound, 2D echocardiogram pending.  History of GERD.    No acute issues  History of arthritis.  Consider Tylenol for pain if needed.  Hold off with celecoxib.  VTE Prophylaxis: Lovenox  Code Status:  Full code  Family Communication: Unable to reach the patient's spouse on the phone listed.  Disposition Plan: Home with home health. Check carotid ultrasound, 2D echocardiogram.. Follow neurology recommendations on disposition..   Consultants:  Neurology   Procedures:  None  Antibiotics:  Anti-infectives (From admission, onward)   None     Subjective: Today, patient feels okay.  Still has a left-sided weakness including grip weakness.   Objective: Vitals:   12/18/19 2315 12/19/19 0423  BP: (!) 155/77 (!) 166/83  Pulse: 69 66  Resp: 18 18  Temp: 98.5 F (36.9 C) 98.4 F (36.9 C)  SpO2: 99% 96%  Intake/Output Summary (Last 24 hours) at 12/19/2019 0812 Last data filed at 12/19/2019 0300 Gross per 24 hour  Intake --  Output 1 ml  Net -1 ml   Filed Weights   12/18/19 2315  Weight: 106.9 kg   Body mass index is 38.04 kg/m.   Physical Exam: GENERAL: Patient is alert awake and oriented.  Morbidly obese, not in obvious distress. HENT: No scleral pallor or icterus. Pupils equally reactive to light. Oral mucosa is moist NECK: is supple, no palpable thyroid  enlargement. CHEST: Clear to auscultation. No crackles or wheezes. Non tender on palpation. Diminished breath sounds bilaterally. CVS: S1 and S2 heard, no murmur. Regular rate and rhythm. No pericardial rub. ABDOMEN: Soft, non-tender, bowel sounds are present. EXTREMITIES: No edema. CNS: Cranial nerves are intact.  Mild left-sided weakness. SKIN: warm and dry without rashes.  Data Review: I have personally reviewed the following laboratory data and studies,  CBC: Recent Labs  Lab 12/18/19 1115 12/18/19 1135 12/19/19 0344  WBC 5.4  --  5.3  NEUTROABS 3.7  --   --   HGB 12.7 14.3 11.7*  HCT 42.2 42.0 37.4  MCV 87.7  --  85.0  PLT 269  --  236   Basic Metabolic Panel: Recent Labs  Lab 12/18/19 1115 12/18/19 1135 12/19/19 0344  NA 141 142 141  K 3.6 3.6 3.6  CL 106 108 107  CO2 25  --  22  GLUCOSE 123* 120* 119*  BUN 14 18 17   CREATININE 0.64 0.60 0.62  CALCIUM 9.5  --  8.8*  MG  --   --  1.9   Liver Function Tests: Recent Labs  Lab 12/18/19 1115  AST 25  ALT 20  ALKPHOS 68  BILITOT 0.9  PROT 7.9  ALBUMIN 4.2   No results for input(s): LIPASE, AMYLASE in the last 168 hours. No results for input(s): AMMONIA in the last 168 hours. Cardiac Enzymes: No results for input(s): CKTOTAL, CKMB, CKMBINDEX, TROPONINI in the last 168 hours. BNP (last 3 results) No results for input(s): BNP in the last 8760 hours.  ProBNP (last 3 results) No results for input(s): PROBNP in the last 8760 hours.  CBG: No results for input(s): GLUCAP in the last 168 hours. Recent Results (from the past 240 hour(s))  SARS CORONAVIRUS 2 (TAT 6-24 HRS) Nasopharyngeal Nasopharyngeal Swab     Status: None   Collection Time: 12/18/19 12:33 PM   Specimen: Nasopharyngeal Swab  Result Value Ref Range Status   SARS Coronavirus 2 NEGATIVE NEGATIVE Final    Comment: (NOTE) SARS-CoV-2 target nucleic acids are NOT DETECTED. The SARS-CoV-2 RNA is generally detectable in upper and lower respiratory  specimens during the acute phase of infection. Negative results do not preclude SARS-CoV-2 infection, do not rule out co-infections with other pathogens, and should not be used as the sole basis for treatment or other patient management decisions. Negative results must be combined with clinical observations, patient history, and epidemiological information. The expected result is Negative. Fact Sheet for Patients: 12/20/19 Fact Sheet for Healthcare Providers: HairSlick.no This test is not yet approved or cleared by the quierodirigir.com FDA and  has been authorized for detection and/or diagnosis of SARS-CoV-2 by FDA under an Emergency Use Authorization (EUA). This EUA will remain  in effect (meaning this test can be used) for the duration of the COVID-19 declaration under Section 56 4(b)(1) of the Act, 21 U.S.C. section 360bbb-3(b)(1), unless the authorization is terminated or revoked sooner. Performed at [facility]  [facility], [address], [place], [place] [address]      Studies: CT HEAD WO CONTRAST  Result Date: 12/18/2019 CLINICAL DATA:  Pt reports that she woke up Monday morning with weakness and numbness to her L arm and L leg, saw her pcp this am who sent her here due to ataxia. Pts speech is clear, face symmetrical, equal grip strength bilaterally, decreased sensation to L arm, a/ox4, resp e/u, nad. EXAM: CT HEAD WITHOUT CONTRAST TECHNIQUE: Contiguous axial images were obtained from the base of the skull through the vertex without intravenous contrast. COMPARISON:  None. FINDINGS: Brain: No evidence of acute infarction, hemorrhage, hydrocephalus, extra-axial collection or mass lesion/mass effect. Patchy bilateral white matter hypoattenuation is noted consistent with moderate chronic microvascular ischemic change. Probable small old lacunar infarct noted in the left centrum semiovale. Vascular: No hyperdense vessel or  unexpected calcification. Skull: Normal. Negative for fracture or focal lesion. Sinuses/Orbits: Globes and orbits are unremarkable. Visualized sinuses and mastoid air cells are clear. Other: None. IMPRESSION: 1. No acute intracranial abnormalities. 2. Moderate chronic microvascular ischemic change. Probable old left centrum semiovale deep white matter lacunar infarct. Electronically Signed   By: Amie Portland M.D.   On: 12/18/2019 13:42   MR ANGIO HEAD WO CONTRAST  Result Date: 12/18/2019 CLINICAL DATA:  Stroke.  Left-sided weakness. EXAM: MRI HEAD WITHOUT CONTRAST MRA HEAD WITHOUT CONTRAST TECHNIQUE: Multiplanar, multiecho pulse sequences of the brain and surrounding structures were obtained without intravenous contrast. Angiographic images of the head were obtained using MRA technique without contrast. COMPARISON:  CT head 12/18/2019 FINDINGS: MRI HEAD FINDINGS Brain: Acute infarct right parietal deep white matter measuring approximately 10 x 15 mm. No other acute infarct. Moderate chronic microvascular ischemic changes throughout the white matter. Mild chronic ischemia in the pons and thalamus bilaterally. Negative for hemorrhage or mass. Ventricle size normal. Vascular: Normal arterial flow voids Skull and upper cervical spine: No focal skeletal lesion. Sinuses/Orbits: Negative Other: None MRA HEAD FINDINGS Right vertebral dominant and widely patent. Non dominant left vertebral artery with moderately severe stenosis distal to left PICA. Left PICA patent. Small right PICA patent. AICA and superior cerebellar arteries patent bilaterally. Fetal origin right posterior cerebral artery. Mild stenosis distal left posterior cerebral artery. Internal carotid artery patent bilaterally without stenosis or aneurysm. Anterior and middle cerebral arteries patent bilaterally. IMPRESSION: 1. Acute infarct in the right parietal deep white matter. 2. Moderate chronic microvascular ischemic change 3. Moderate stenosis distal  left vertebral artery. No intracranial large vessel occlusion. Electronically Signed   By: Marlan Palau M.D.   On: 12/18/2019 18:24   MR BRAIN WO CONTRAST  Result Date: 12/18/2019 CLINICAL DATA:  Stroke.  Left-sided weakness. EXAM: MRI HEAD WITHOUT CONTRAST MRA HEAD WITHOUT CONTRAST TECHNIQUE: Multiplanar, multiecho pulse sequences of the brain and surrounding structures were obtained without intravenous contrast. Angiographic images of the head were obtained using MRA technique without contrast. COMPARISON:  CT head 12/18/2019 FINDINGS: MRI HEAD FINDINGS Brain: Acute infarct right parietal deep white matter measuring approximately 10 x 15 mm. No other acute infarct. Moderate chronic microvascular ischemic changes throughout the white matter. Mild chronic ischemia in the pons and thalamus bilaterally. Negative for hemorrhage or mass. Ventricle size normal. Vascular: Normal arterial flow voids Skull and upper cervical spine: No focal skeletal lesion. Sinuses/Orbits: Negative Other: None MRA HEAD FINDINGS Right vertebral dominant and widely patent. Non dominant left vertebral artery with moderately severe stenosis distal to left PICA. Left PICA patent. Small right PICA patent.  AICA and superior cerebellar arteries patent bilaterally. Fetal origin right posterior cerebral artery. Mild stenosis distal left posterior cerebral artery. Internal carotid artery patent bilaterally without stenosis or aneurysm. Anterior and middle cerebral arteries patent bilaterally. IMPRESSION: 1. Acute infarct in the right parietal deep white matter. 2. Moderate chronic microvascular ischemic change 3. Moderate stenosis distal left vertebral artery. No intracranial large vessel occlusion. Electronically Signed   By: Marlan Palau M.D.   On: 12/18/2019 18:24    Scheduled Meds: .  stroke: mapping our early stages of recovery book   Does not apply Once  . aspirin EC  81 mg Oral Daily  . atorvastatin  40 mg Oral q1800  .  cholecalciferol  1,000 Units Oral Q breakfast  . clopidogrel  75 mg Oral Daily  . enoxaparin (LOVENOX) injection  40 mg Subcutaneous Q24H  . vitamin B-12  100 mcg Oral Q breakfast    Continuous Infusions: . sodium chloride 75 mL/hr at 12/18/19 1857    Joycelyn Das, MD  [facility] 12/19/2019

## 2019-12-19 NOTE — Progress Notes (Signed)
STROKE TEAM PROGRESS NOTE   INTERVAL HISTORY Pt sitting in chair stated that her left sided weakness much improved and numbness has resolved. She said her BP at home was good and she does not smoke.   Vitals:   12/18/19 2315 12/19/19 0423 12/19/19 0858 12/19/19 1141  BP: (!) 155/77 (!) 166/83 (!) 169/81 (!) 160/79  Pulse: 69 66 73 61  Resp: 18 18 20 16   Temp: 98.5 F (36.9 C) 98.4 F (36.9 C) 98.5 F (36.9 C) 97.6 F (36.4 C)  TempSrc: Oral Oral Oral Oral  SpO2: 99% 96% 95% 99%  Weight: 106.9 kg     Height: 5\' 6"  (1.676 m)       CBC:  Recent Labs  Lab 12/18/19 1115 12/18/19 1115 12/18/19 1135 12/19/19 0344  WBC 5.4  --   --  5.3  NEUTROABS 3.7  --   --   --   HGB 12.7   < > 14.3 11.7*  HCT 42.2   < > 42.0 37.4  MCV 87.7  --   --  85.0  PLT 269  --   --  236   < > = values in this interval not displayed.    Basic Metabolic Panel:  Recent Labs  Lab 12/18/19 1115 12/18/19 1115 12/18/19 1135 12/19/19 0344  NA 141   < > 142 141  K 3.6   < > 3.6 3.6  CL 106   < > 108 107  CO2 25  --   --  22  GLUCOSE 123*   < > 120* 119*  BUN 14   < > 18 17  CREATININE 0.64   < > 0.60 0.62  CALCIUM 9.5  --   --  8.8*  MG  --   --   --  1.9   < > = values in this interval not displayed.   Lipid Panel:     Component Value Date/Time   CHOL 187 12/19/2019 0344   TRIG 70 12/19/2019 0344   HDL 55 12/19/2019 0344   CHOLHDL 3.4 12/19/2019 0344   VLDL 14 12/19/2019 0344   LDLCALC 118 (H) 12/19/2019 0344   HgbA1c:  Lab Results  Component Value Date   HGBA1C 6.1 (H) 12/19/2019   Urine Drug Screen: No results found for: LABOPIA, COCAINSCRNUR, LABBENZ, AMPHETMU, THCU, LABBARB  Alcohol Level No results found for: ETH  IMAGING past 48 hours CT HEAD WO CONTRAST  Result Date: 12/18/2019 CLINICAL DATA:  Pt reports that she woke up Monday morning with weakness and numbness to her L arm and L leg, saw her pcp this am who sent her here due to ataxia. Pts speech is clear, face  symmetrical, equal grip strength bilaterally, decreased sensation to L arm, a/ox4, resp e/u, nad. EXAM: CT HEAD WITHOUT CONTRAST TECHNIQUE: Contiguous axial images were obtained from the base of the skull through the vertex without intravenous contrast. COMPARISON:  None. FINDINGS: Brain: No evidence of acute infarction, hemorrhage, hydrocephalus, extra-axial collection or mass lesion/mass effect. Patchy bilateral white matter hypoattenuation is noted consistent with moderate chronic microvascular ischemic change. Probable small old lacunar infarct noted in the left centrum semiovale. Vascular: No hyperdense vessel or unexpected calcification. Skull: Normal. Negative for fracture or focal lesion. Sinuses/Orbits: Globes and orbits are unremarkable. Visualized sinuses and mastoid air cells are clear. Other: None. IMPRESSION: 1. No acute intracranial abnormalities. 2. Moderate chronic microvascular ischemic change. Probable old left centrum semiovale deep white matter lacunar infarct. Electronically Signed   By:  Amie Portland M.D.   On: 12/18/2019 13:42   MR ANGIO HEAD WO CONTRAST  Result Date: 12/18/2019 CLINICAL DATA:  Stroke.  Left-sided weakness. EXAM: MRI HEAD WITHOUT CONTRAST MRA HEAD WITHOUT CONTRAST TECHNIQUE: Multiplanar, multiecho pulse sequences of the brain and surrounding structures were obtained without intravenous contrast. Angiographic images of the head were obtained using MRA technique without contrast. COMPARISON:  CT head 12/18/2019 FINDINGS: MRI HEAD FINDINGS Brain: Acute infarct right parietal deep white matter measuring approximately 10 x 15 mm. No other acute infarct. Moderate chronic microvascular ischemic changes throughout the white matter. Mild chronic ischemia in the pons and thalamus bilaterally. Negative for hemorrhage or mass. Ventricle size normal. Vascular: Normal arterial flow voids Skull and upper cervical spine: No focal skeletal lesion. Sinuses/Orbits: Negative Other: None MRA  HEAD FINDINGS Right vertebral dominant and widely patent. Non dominant left vertebral artery with moderately severe stenosis distal to left PICA. Left PICA patent. Small right PICA patent. AICA and superior cerebellar arteries patent bilaterally. Fetal origin right posterior cerebral artery. Mild stenosis distal left posterior cerebral artery. Internal carotid artery patent bilaterally without stenosis or aneurysm. Anterior and middle cerebral arteries patent bilaterally. IMPRESSION: 1. Acute infarct in the right parietal deep white matter. 2. Moderate chronic microvascular ischemic change 3. Moderate stenosis distal left vertebral artery. No intracranial large vessel occlusion. Electronically Signed   By: Marlan Palau M.D.   On: 12/18/2019 18:24   MR BRAIN WO CONTRAST  Result Date: 12/18/2019 CLINICAL DATA:  Stroke.  Left-sided weakness. EXAM: MRI HEAD WITHOUT CONTRAST MRA HEAD WITHOUT CONTRAST TECHNIQUE: Multiplanar, multiecho pulse sequences of the brain and surrounding structures were obtained without intravenous contrast. Angiographic images of the head were obtained using MRA technique without contrast. COMPARISON:  CT head 12/18/2019 FINDINGS: MRI HEAD FINDINGS Brain: Acute infarct right parietal deep white matter measuring approximately 10 x 15 mm. No other acute infarct. Moderate chronic microvascular ischemic changes throughout the white matter. Mild chronic ischemia in the pons and thalamus bilaterally. Negative for hemorrhage or mass. Ventricle size normal. Vascular: Normal arterial flow voids Skull and upper cervical spine: No focal skeletal lesion. Sinuses/Orbits: Negative Other: None MRA HEAD FINDINGS Right vertebral dominant and widely patent. Non dominant left vertebral artery with moderately severe stenosis distal to left PICA. Left PICA patent. Small right PICA patent. AICA and superior cerebellar arteries patent bilaterally. Fetal origin right posterior cerebral artery. Mild stenosis distal  left posterior cerebral artery. Internal carotid artery patent bilaterally without stenosis or aneurysm. Anterior and middle cerebral arteries patent bilaterally. IMPRESSION: 1. Acute infarct in the right parietal deep white matter. 2. Moderate chronic microvascular ischemic change 3. Moderate stenosis distal left vertebral artery. No intracranial large vessel occlusion. Electronically Signed   By: Marlan Palau M.D.   On: 12/18/2019 18:24    PHYSICAL EXAM  Temp:  [97.4 F (36.3 C)-98.5 F (36.9 C)] 98.4 F (36.9 C) (01/14 1505) Pulse Rate:  [59-97] 66 (01/14 1505) Resp:  [13-25] 16 (01/14 1505) BP: (154-195)/(71-137) 158/97 (01/14 1505) SpO2:  [95 %-100 %] 99 % (01/14 1505) Weight:  [106.9 kg] 106.9 kg (01/13 2315)  General - Well nourished, well developed, in no apparent distress.  Ophthalmologic - fundi not visualized due to noncooperation.  Cardiovascular - Regular rhythm and rate.  Mental Status -  Level of arousal and orientation to time, place, and person were intact. Language including expression, naming, repetition, comprehension was assessed and found intact. Fund of Knowledge was assessed and was intact.  Cranial Nerves  II - XII - II - Visual field intact OU. III, IV, VI - Extraocular movements intact. V - Facial sensation intact bilaterally. VII - Facial movement intact bilaterally. VIII - Hearing & vestibular intact bilaterally. X - Palate elevates symmetrically. XI - Chin turning & shoulder shrug intact bilaterally. XII - Tongue protrusion intact.  Motor Strength - The patient's strength was normal in right UE and LE, LUE 4/5 with pronator drift. LLE 5/5 proximal and 4+/5 distal DF/PF.  Bulk was normal and fasciculations were absent.   Motor Tone - Muscle tone was assessed at the neck and appendages and was normal.  Reflexes - The patient's reflexes were symmetrical in all extremities and she had no pathological reflexes.  Sensory - Light touch,  temperature/pinprick were assessed and were symmetrical.    Coordination - The patient had normal movements in the hands with no ataxia or dysmetria.  Tremor was absent.  Gait and Station - deferred.   ASSESSMENT/PLAN [name] is a 69 y.o. female with history of GERD, arthritis presenting with 3 day hx L sided weakness.   Stroke: R CR/SO infarct secondary to small vessel disease source  CT head No acute abnormality. Moderate small vessel disease. old L centrum semiovale lacune   MRI / MRA  R parietal white matter infarct. Moderate small vessel disease. Moderate distal L VA stenosis   Carotid Doppler unremarkable  2D Echo pending  LDL 118  HgbA1c 6.1  Lovenox 40 mg sq daily for VTE prophylaxis  No antithrombotic prior to admission, now on aspirin 81 mg daily and clopidogrel 75 mg daily. Continue DAPT x 3 weeks then aspirin alone  Therapy recommendations:  OP PT, OP OT  Disposition:  pending   Hypertensive Urgency  BP on admission 201/104  Stable in the 160s now  Pt stated that at home she checked her BP was 120s/80s . Permissive hypertension (OK if < 220/120) but gradually normalize in 3-5 days . Long-term BP goal normotensive  Hyperlipidemia  Home meds:  No statin  Now on lipitor 40  LDL 118, goal < 70  Continue statin at discharge  Other Stroke Risk Factors  Advanced age  Social ETOH use,  advised to drink no more than 1 drink(s) a day  Obesity, Body mass index is 38.04 kg/m., recommend weight loss, diet and exercise as appropriate   Other Active Problems  Arthritis  GERD  Hospital day # 1  Neurology will sign off. Please call with questions. Pt will follow up with stroke clinic NP at [facility] in about 4 weeks. Thanks for the consult.  Marvel Plan, MD PhD Stroke Neurology 12/19/2019 3:30 PM   To contact Stroke Continuity provider, please refer to [email or web address]. After hours, contact General Neurology

## 2019-12-19 NOTE — TOC Initial Note (Signed)
Transition of Care [facility]) - Initial/Assessment Note    Patient Details  Name: [name] MRN: [id number] Date of Birth: 08-Feb-1951  [facility] CM/SW Contact:    Kermit Balo, RN Phone Number: 12/19/2019, 2:19 PM  Clinical Narrative:                 Pt with recommendations for outpatient therapy. Pt asking to attend in [facility] since this is closer to her home. Orders in Epic and information on the AVS. Pt has transportation to outpatient and will have transportation home when medically ready.   Expected Discharge Plan: OP Rehab Barriers to Discharge: Continued Medical Work up   Patient Goals and CMS Choice     Choice offered to / list presented to : Patient  Expected Discharge Plan and Services Expected Discharge Plan: OP Rehab   Discharge Planning Services: CM Consult   Living arrangements for the past 2 months: Single Family Home                                      Prior Living Arrangements/Services Living arrangements for the past 2 months: Single Family Home Lives with:: Spouse Patient language and need for interpreter reviewed:: Yes Do you feel safe going back to the place where you live?: Yes      Need for Family Participation in Patient Care: No (Comment) Care giver support system in place?: Yes (comment)   Criminal Activity/Legal Involvement Pertinent to Current Situation/Hospitalization: No - Comment as needed  Activities of Daily Living Home Assistive Devices/Equipment: None ADL Screening (condition at time of admission) Patient's cognitive ability adequate to safely complete daily activities?: Yes Is the patient deaf or have difficulty hearing?: No Does the patient have difficulty seeing, even when wearing glasses/contacts?: No Does the patient have difficulty concentrating, remembering, or making decisions?: No Patient able to express need for assistance with ADLs?: Yes Does the patient have difficulty dressing or  bathing?: No Independently performs ADLs?: Yes (appropriate for developmental age) Does the patient have difficulty walking or climbing stairs?: No Weakness of Legs: None Weakness of Arms/Hands: None  Permission Sought/Granted                  Emotional Assessment Appearance:: Appears stated age Attitude/Demeanor/Rapport: Engaged Affect (typically observed): Accepting, Pleasant Orientation: : Oriented to Self, Oriented to Place, Oriented to  Time, Oriented to Situation   Psych Involvement: No (comment)  Admission diagnosis:  Acute CVA (cerebrovascular accident) [facility]) [I63.9] Cerebrovascular accident (CVA), unspecified mechanism (HCC) [I63.9] Patient Active Problem List   Diagnosis Date Noted  . Acute CVA (cerebrovascular accident) (HCC) 12/18/2019  . Allergy or intolerance to drug 05/27/2013  . Postoperative anemia due to acute blood loss 05/27/2013  . Osteoarthritis of right hip 05/24/2013   PCP:  Joette Catching, MD Pharmacy:   [facility] [phone] - [place], [place] - [address] [place] [address] Phone: [phone] Fax: [phone]     Social Determinants of Health (SDOH) Interventions    Readmission Risk Interventions No flowsheet data found.

## 2019-12-19 NOTE — Evaluation (Signed)
Occupational Therapy Evaluation Patient Details Name: [name] MRN: [id number] DOB: [birth year] Today's Date: 12/19/2019    History of Present Illness [name] is a 69 y.o. female who presents with L sided weakness and numbess. MRI revealed Acute infarct in the right parietal deep white matter.PMH includes arthritis, GERD, R THA 2014.   Clinical Impression   PTA pt living with husband and son, independent without AD and active in community. At time of eval, pt is supervision for bed mobility and min guard- min A for functional transfers. Pt able to complete functional mobility to bathroom with RW at min guard level. Toilet transfer completed at min guard level when grab bars used. Pt able to perform peri care with L affected UE without assist. She then was able to stand at the sink with min guard assist to complete 3 grooming tasks. Challenged pt to use LUE at this time, noted mild ataxia still present. She also endorses sensation changes with proprioceptive deficits. Given current status, recommend OP OT at d/c for higher intensity neurological therapy. Will continue to follow per POC listed below.     Follow Up Recommendations  Outpatient OT;Supervision/Assistance - 24 hour;Other (comment)(neuro center)    Equipment Recommendations  3 in 1 bedside commode    Recommendations for Other Services       Precautions / Restrictions Precautions Precautions: Fall Restrictions Weight Bearing Restrictions: No      Mobility Bed Mobility Overal bed mobility: Needs Assistance Bed Mobility: Supine to Sit     Supine to sit: Supervision        Transfers Overall transfer level: Needs assistance Equipment used: Rolling walker (2 wheeled) Transfers: Sit to/from Stand Sit to Stand: Min assist;Min guard         General transfer comment: min A to power up and rise from bed; improved to min guard with use of grab bar    Balance Overall balance assessment: Needs  assistance Sitting-balance support: No upper extremity supported;Feet unsupported Sitting balance-Leahy Scale: Fair     Standing balance support: Bilateral upper extremity supported;During functional activity Standing balance-Leahy Scale: Poor Standing balance comment: reliant on external support when brushing teeth at sink                           ADL either performed or assessed with clinical judgement   ADL Overall ADL's : Needs assistance/impaired Eating/Feeding: Set up;Sitting   Grooming: Min guard;Standing;Oral care;Wash/dry face Grooming Details (indicate cue type and reason): at sink in room. Had pt reach with LUE, ataxia present Upper Body Bathing: Minimal assistance;Sitting   Lower Body Bathing: Minimal assistance;Sitting/lateral leans;Sit to/from stand   Upper Body Dressing : Minimal assistance;Sitting   Lower Body Dressing: Minimal assistance;Sit to/from stand;Sitting/lateral leans   Toilet Transfer: Min guard;Minimal assistance;Ambulation;Regular Toilet;RW   Toileting- Clothing Manipulation and Hygiene: Set up;Sit to/from stand Toileting - Clothing Manipulation Details (indicate cue type and reason): used L hand without assist Tub/ Shower Transfer: Min guard;Minimal assistance;Ambulation;Shower seat;Rolling walker   Functional mobility during ADLs: Min guard;Rolling walker General ADL Comments: pt limited by LUE sensory changes and decreased activty tolerance for BADL     Vision Patient Visual Report: No change from baseline       Perception     Praxis      Pertinent Vitals/Pain Pain Assessment: No/denies pain     Hand Dominance Right   Extremity/Trunk Assessment Upper Extremity Assessment Upper Extremity Assessment: LUE deficits/detail LUE Deficits /  Details: compromised sensation/proprioception. Reports numbess. Mild ataxia noted   Lower Extremity Assessment Lower Extremity Assessment: Defer to PT evaluation       Communication  Communication Communication: No difficulties   Cognition Arousal/Alertness: Awake/alert Behavior During Therapy: WFL for tasks assessed/performed Overall Cognitive Status: Within Functional Limits for tasks assessed                                     General Comments       Exercises     Shoulder Instructions      Home Living Family/patient expects to be discharged to:: Private residence Living Arrangements: Spouse/significant other;Children Available Help at Discharge: Family;Available 24 hours/day Type of Home: House Home Access: Stairs to enter [employer] of Steps: 6 Entrance Stairs-Rails: Can reach both Home Layout: Two level;Able to live on main level with bedroom/bathroom;Laundry or work area in Artist of Steps: flight down to basement, does not use   Bathroom Shower/Tub: Tub/shower unit;Curtain         Home Equipment: Environmental consultant - 2 wheels;Shower seat;Adaptive equipment;Cane - single point Adaptive Equipment: Reacher;Sock aid    Lives With: Spouse    Prior Functioning/Environment Level of Independence: Independent        Comments: 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 Drives        OT Problem List: Decreased strength;Decreased knowledge of use of DME or AE;Decreased coordination;Decreased activity tolerance;Impaired UE functional use;Impaired balance (sitting and/or standing);Impaired sensation      OT Treatment/Interventions: Self-care/ADL training;Therapeutic exercise;Patient/family education;Balance training;Neuromuscular education;Energy conservation;Therapeutic activities;DME and/or AE instruction    OT Goals(Current goals can be found in the care plan section) Acute Rehab OT Goals Patient Stated Goal: get back to independence OT Goal Formulation: With patient Time For Goal Achievement: 01/02/20 Potential to Achieve Goals: Good  OT Frequency: Min  2X/week   Barriers to D/C:            Co-evaluation              AM-PAC OT "6 Clicks" Daily Activity     Outcome Measure Help from another person eating meals?: None Help from another person taking care of personal grooming?: A Little Help from another person toileting, which includes using toliet, bedpan, or urinal?: A Little Help from another person bathing (including washing, rinsing, drying)?: A Little Help from another person to put on and taking off regular upper body clothing?: A Little Help from another person to put on and taking off regular lower body clothing?: A Little 6 Click Score: 19   End of Session Equipment Utilized During Treatment: Gait belt;Rolling walker Nurse Communication: Mobility status  Activity Tolerance: Patient tolerated treatment well Patient left: Other (comment)(in hall finishing with PT)  OT Visit Diagnosis: Unsteadiness on feet (R26.81);Other abnormalities of gait and mobility (R26.89);Muscle weakness (generalized) (M62.81);Other symptoms and signs involving the nervous system (R29.898);Hemiplegia and hemiparesis Hemiplegia - Right/Left: Left Hemiplegia - dominant/non-dominant: Non-Dominant Hemiplegia - caused by: Cerebral infarction                Time: 5035-4656 OT Time Calculation (min): 29 min Charges:  OT General Charges $OT Visit: 1 Visit OT Evaluation $OT Eval Moderate Complexity: 1 Mod OT Treatments $Self Care/Home Management : 8-22 mins  Dalphine Handing, MSOT, OTR/L Acute Rehabilitation Services [facility] Office Number: [phone]  Dalphine Handing 12/19/2019, 11:34 AM

## 2019-12-19 NOTE — Evaluation (Signed)
Physical Therapy Evaluation Patient Details Name: [name] MRN: [id number] DOB: 10-04-1951 Today's Date: 12/19/2019   History of Present Illness  [name] is a 69 y.o. female who presents with L sided weakness and numbess. MRI revealed Acute infarct in the right parietal deep white matter.PMH includes arthritis, GERD, R THA 2014.  Clinical Impression  Pt admitted with above. Prior to admission, pt lives with her husband and son and works as a Oceanographer. On PT evaluation, pt presents with decreased functional mobility secondary to left sided sensation impairments, decreased coordination, balance impairments, and decreased gait speed. Pt ambulating 200 feet at a min guard assist level; able to negotiate 9 steps to simulate home set up. Recommended walker for all mobility initially for increased independence and stability. Highly recommending OPPT to address deficits.    Follow Up Recommendations Outpatient PT    Equipment Recommendations  None recommended by PT    Recommendations for Other Services       Precautions / Restrictions Precautions Precautions: Fall Restrictions Weight Bearing Restrictions: No      Mobility  Bed Mobility Overal bed mobility: Needs Assistance Bed Mobility: Supine to Sit     Supine to sit: Supervision     General bed mobility comments: OOB with OT upon entry  Transfers Overall transfer level: Needs assistance Equipment used: None Transfers: Sit to/from Stand Sit to Stand: Min guard         General transfer comment: min A to power up and rise from bed; improved to min guard with use of grab bar  Ambulation/Gait Ambulation/Gait assistance: Min guard Gait Distance (Feet): 200 Feet Assistive device: None Gait Pattern/deviations: Step-through pattern;Decreased stride length;Decreased dorsiflexion - left Gait velocity: decreased   General Gait Details: Pt with slow, cautious gait pattern with decreased stride length.  Cues for reciprocal arm swing, increased gait speed  Stairs Stairs: Yes Stairs assistance: Supervision Stair Management: Two rails Number of Stairs: 9 General stair comments: Cues for sequencing, step by step  Wheelchair Mobility    Modified Rankin (Stroke Patients Only) Modified Rankin (Stroke Patients Only) Pre-Morbid Rankin Score: No symptoms Modified Rankin: Moderately severe disability     Balance Overall balance assessment: Needs assistance Sitting-balance support: No upper extremity supported;Feet unsupported Sitting balance-Leahy Scale: Good     Standing balance support: No upper extremity supported;During functional activity Standing balance-Leahy Scale: Fair Standing balance comment: reliant on external support when brushing teeth at sink                             Pertinent Vitals/Pain Pain Assessment: Faces Faces Pain Scale: No hurt    Home Living Family/patient expects to be discharged to:: Private residence Living Arrangements: Spouse/significant other;Children Available Help at Discharge: Family;Available 24 hours/day Type of Home: House Home Access: Stairs to enter Entrance Stairs-Rails: Can reach both Entrance Stairs-Number of Steps: 6 Home Layout: Two level;Able to live on main level with bedroom/bathroom;Laundry or work area in [place]: Environmental consultant - 2 wheels;Shower seat;Adaptive equipment;Cane - single point      Prior Function Level of Independence: Independent         Comments: 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 Drives     Hand Dominance   Dominant Hand: Right    Extremity/Trunk Assessment   Upper Extremity Assessment Upper Extremity Assessment: Defer to OT evaluation LUE Deficits / Details: compromised sensation/proprioception. Reports numbess. Mild ataxia noted  Lower Extremity Assessment Lower Extremity Assessment: LLE deficits/detail;RLE  deficits/detail RLE Deficits / Details: Strength 5/5 LLE Deficits / Details: Strength 5/5       Communication   Communication: No difficulties  Cognition Arousal/Alertness: Awake/alert Behavior During Therapy: WFL for tasks assessed/performed Overall Cognitive Status: Within Functional Limits for tasks assessed                                        General Comments      Exercises     Assessment/Plan    PT Assessment Patient needs continued PT services  PT Problem List Decreased strength;Decreased balance;Decreased mobility;Decreased coordination       PT Treatment Interventions DME instruction;Gait training;Stair training;Functional mobility training;Therapeutic activities;Therapeutic exercise;Balance training;Patient/family education    PT Goals (Current goals can be found in the Care Plan section)  Acute Rehab PT Goals Patient Stated Goal: get back to independence PT Goal Formulation: With patient Time For Goal Achievement: 01/02/20 Potential to Achieve Goals: Good    Frequency Min 4X/week   Barriers to discharge        Co-evaluation               AM-PAC PT "6 Clicks" Mobility  Outcome Measure Help needed turning from your back to your side while in a flat bed without using bedrails?: None Help needed moving from lying on your back to sitting on the side of a flat bed without using bedrails?: None Help needed moving to and from a bed to a chair (including a wheelchair)?: A Little Help needed standing up from a chair using your arms (e.g., wheelchair or bedside chair)?: None Help needed to walk in hospital room?: A Little Help needed climbing 3-5 steps with a railing? : A Little 6 Click Score: 21    End of Session Equipment Utilized During Treatment: Gait belt Activity Tolerance: Patient tolerated treatment well Patient left: in chair;with call bell/phone within reach;with chair alarm set Nurse Communication: Mobility status PT Visit  Diagnosis: Unsteadiness on feet (R26.81);Other abnormalities of gait and mobility (R26.89);Difficulty in walking, not elsewhere classified (R26.2);Other symptoms and signs involving the nervous system (R29.898)    Time: 6962-9528 PT Time Calculation (min) (ACUTE ONLY): 22 min   Charges:   PT Evaluation $PT Eval Moderate Complexity: 1 Mod          Laurina Bustle, [place], DPT Acute Rehabilitation Services Pager [phone] Office [phone]   Vanetta Mulders 12/19/2019, 1:23 PM

## 2019-12-19 NOTE — Evaluation (Signed)
Speech Language Pathology Evaluation Patient Details Name: ANALA WHISENANT MRN: [id number] DOB: 10/04/1951 Today's Date: 12/19/2019 Time: 9379-0240 SLP Time Calculation (min) (ACUTE ONLY): 25 min  Problem List:  Patient Active Problem List   Diagnosis Date Noted  . Acute CVA (cerebrovascular accident) (HCC) 12/18/2019  . Allergy or intolerance to drug 05/27/2013  . Postoperative anemia due to acute blood loss 05/27/2013  . Osteoarthritis of right hip 05/24/2013   Past Medical History:  Past Medical History:  Diagnosis Date  . Arthritis   . GERD (gastroesophageal reflux disease)    Past Surgical History:  Past Surgical History:  Procedure Laterality Date  . NO PAST SURGERIES    . TOTAL HIP ARTHROPLASTY Right 05/22/2013   Procedure: TOTAL HIP ARTHROPLASTY- right ;  Surgeon: Loreta Ave, MD;  Location: [facility] OR;  Service: Orthopedics;  Laterality: Right;   HPI:  Ms. LASHYA PASSE, 68y/f, presented to ED with weakness and numbness of left side of body. PMH for srthritis and GERD.  MRI with acute infarct in the right parietal deep white matter.  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    Assessment / Plan / Recommendation Clinical Impression  Cognitive Language Evaluation completed. Patient found to have normal cognition and language abilities consistent with her baseline abilities. At onset of stroke pt reported no dyarthria or aphasia. She is communicating at the conversational level with intact memory, organizational skills, attention and higher level cognitive skills. No further Speech Therapy is recommended at this time.     SLP Assessment  SLP Recommendation/Assessment: Patient does not need any further Speech Lanaguage Pathology Services SLP Visit Diagnosis: Cognitive communication deficit (R41.841)    Follow Up Recommendations  None    Frequency and Duration           SLP Evaluation Cognition  Overall Cognitive Status: Within Functional Limits  for tasks assessed Arousal/Alertness: Awake/alert Orientation Level: Oriented X4 Attention: Focused;Sustained;Selective Focused Attention: Appears intact Sustained Attention: Appears intact Selective Attention: Appears intact Memory: Appears intact Immediate Memory Recall: Sock;Blue;Bed Memory Recall Sock: Without Cue Memory Recall Blue: Without Cue Memory Recall Bed: Without Cue Awareness: Appears intact Problem Solving: Appears intact Executive Function: Reasoning;Sequencing;Organizing Reasoning: Appears intact Sequencing: Appears intact Organizing: Appears intact       Comprehension  Auditory Comprehension Overall Auditory Comprehension: Appears within functional limits for tasks assessed Yes/No Questions: Within Functional Limits Commands: Within Functional Limits Conversation: Complex Visual Recognition/Discrimination Discrimination: Within Function Limits Reading Comprehension Reading Status: Within funtional limits    Expression Expression Primary Mode of Expression: Verbal Verbal Expression Overall Verbal Expression: Appears within functional limits for tasks assessed Initiation: No impairment Level of Generative/Spontaneous Verbalization: Conversation Repetition: No impairment Naming: No impairment Pragmatics: No impairment Written Expression Dominant Hand: Right Written Expression: Within Functional Limits   Oral / Motor  Oral Motor/Sensory Function Overall Oral Motor/Sensory Function: Within functional limits Motor Speech Overall Motor Speech: Appears within functional limits for tasks assessed Respiration: Within functional limits Phonation: Normal Resonance: Within functional limits Articulation: Within functional limitis Intelligibility: Intelligible Motor Planning: Witnin functional limits Motor Speech Errors: Not applicable   GO                    Luis Abed., MA, CCC-SLP 12/19/2019, 9:38 AM

## 2019-12-20 MED ORDER — CLOPIDOGREL BISULFATE 75 MG PO TABS: 75.0000 mg | ORAL_TABLET | Freq: Every day | ORAL | 0 refills | Status: AC

## 2019-12-20 MED ORDER — ASPIRIN 81 MG PO TBEC: 81.0000 mg | DELAYED_RELEASE_TABLET | Freq: Every day | ORAL | 2 refills | Status: AC

## 2019-12-20 MED ORDER — BENAZEPRIL-HYDROCHLOROTHIAZIDE 20-12.5 MG PO TABS: 1.0000 | ORAL_TABLET | Freq: Every day | ORAL | 11 refills | Status: AC

## 2019-12-20 MED ORDER — ATORVASTATIN CALCIUM 40 MG PO TABS: 40.0000 mg | ORAL_TABLET | Freq: Every day | ORAL | 2 refills | Status: AC

## 2019-12-20 NOTE — Discharge Summary (Signed)
Physician Discharge Summary  [name]:[id number] DOB: [birth year] DOA: 12/18/2019  PCP: Lisa Catching, MD  Admit date: 12/18/2019 Discharge date: 12/20/2019  Admitted From: Home  Discharge disposition: Home with outpatient physical therapy/occupational Therapy   Recommendations for Outpatient Follow-Up:    Follow up with your primary care provider for routine checkup and monitoring of your blood pressure including adjusting blood pressure medication.   Follow-up up with [facility] neurology as outpatient in the stroke clinic in 4 weeks.  Neurology clinic to schedule an appointment.   Please do not take celecoxib or over-the-counter pain medication.  Discharge Diagnosis:   Principal Problem:   Acute CVA (cerebrovascular accident) (HCC) Hypertension Obesity GERD   Discharge Condition: Improved.  Diet recommendation: Low sodium, heart healthy.    Wound care: None.  Code status: Full.   History of Present Illness:   Lisa Hucker Priceis a 69 y.o.femalewith no significant past medical history for arthritis and GERD who presented to the hospital with weakness and numbness of her left side of her body. Patient woke up on Monday morning (12/16/19)with a left-sided weakness and numbness to her left arm and leg. Patient thought it might be secondary to the way she was sleeping but she noticed that that she was dropping things from her left hand and had difficulty walking and weakness in her left leg. Patient did fall while she was trying to sit in her chair today but denied any trauma from the fall. Patient then went to see her primary care physician today who sent her to the hospital for this finding including ataxia. Patient denied any chest pain, palpitation, dizziness or lightheadedness secondary to fall but simply did not feel at her normal. Patient denies history of hypertension in the past. Denies urinary urgency, frequency or dysuria. Patient denies any  history of atrial fibrillation or cardiac problems in the past. Patient reports that she did have full PCP visit November and has been doing well. Denies any exposure to Covid. No fever, cough, chills or rigor. No chest pain palpitation or shortness of breath.  ED Course:In the ED, patient did have mild left upper extremity weakness with handgrip and mild left lower extremity weakness. Patient did have elevated blood pressure in the ED. Neurology was notified from the ED. Since the patient was out of the TPA window,patient was then considered for admission to the hospital for stroke work-up.   Hospital Course:  Following conditions were addressed during hospitalization as listed below,  Left-sided weakness and numbness secondary to acute infarct in the right parietal deep white matter.CT head scan in the ED was negative for acute findings. Patient was out of the TPA window for any intervention. Neurology was consulted.  MRI with acute infarct in the right parietal deep white matter.  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 Hemoglobin A1c at 6.1. Blood pressure was elevated in the ED and will consider antihypertensive on discharge. Patient passed  bedside swallow evaluation.  Has been started on dual antiplatelets,  statins by neurology.  Physical therapy has seen the patient today and recommend outpatient PT and OT on discharge.  Carotid ultrasound did not show any significant stenosis., 2D echocardiogram  with preserved LV function.  Patient will need to follow-up with [facility] neurology as outpatient.  Persistently elevated blood pressure likely newly diagnosed hypertension.  Patient has been started on benazepril HCTZ on discharge to reduce the chance of stroke.  Will need to follow-up with  primary care physician as outpatient closely for this.  History of GERD.   No acute issues  History of arthritis.Consider Tylenol for  pain if needed. Do not take celecoxib.   Disposition.  At this time, patient is stable for disposition home with outpatient PT OT.   Medical Consultants:    Neurology  Procedures:    None Subjective:   Today, patient is okay.  Left hand grip weakness persists.  Denies dizziness, lightheadedness, shortness of breath chest pain.  Discharge Exam:   Vitals:   12/20/19 0315 12/20/19 0806  BP: (!) 184/94 (!) 174/93  Pulse: 71 66  Resp: 16 17  Temp: 97.6 F (36.4 C) 98.4 F (36.9 C)  SpO2: 98% 100%   Vitals:   12/19/19 2015 12/20/19 0037 12/20/19 0315 12/20/19 0806  BP: (!) 166/83 (!) 168/89 (!) 184/94 (!) 174/93  Pulse: 75 70 71 66  Resp: 17 16 16 17   Temp: 98.6 F (37 C) 97.9 F (36.6 C) 97.6 F (36.4 C) 98.4 F (36.9 C)  TempSrc: Oral Oral Oral Oral  SpO2: 99% 99% 98% 100%  Weight:      Height:       General: Alert awake, not in obvious distress, morbidly obese HENT: pupils equally reacting to light and accommodation.  No scleral pallor or icterus noted. Oral mucosa is moist.  Chest:  Clear breath sounds.  Diminished breath sounds bilaterally. No crackles or wheezes.  CVS: S1 &S2 heard. No murmur.  Regular rate and rhythm. Abdomen: Soft, nontender, nondistended.  Bowel sounds are heard.   Extremities: No cyanosis, clubbing or edema.  Peripheral pulses are palpable. Psych: Alert, awake and oriented, normal mood CNS:  No cranial nerve deficits.  Left upper extremity handgrip weakness, mild left lower extremity weakness. Skin: Warm and dry.  No rashes noted.  The results of significant diagnostics from this hospitalization (including imaging, microbiology, ancillary and laboratory) are listed below for reference.     Diagnostic Studies:   CT HEAD WO CONTRAST  Result Date: 12/18/2019 CLINICAL DATA:  Pt reports that she woke up Monday morning with weakness and numbness to her L arm and L leg, saw her pcp this am who sent her here due to ataxia. Pts speech is  clear, face symmetrical, equal grip strength bilaterally, decreased sensation to L arm, a/ox4, resp e/u, nad. EXAM: CT HEAD WITHOUT CONTRAST TECHNIQUE: Contiguous axial images were obtained from the base of the skull through the vertex without intravenous contrast. COMPARISON:  None. FINDINGS: Brain: No evidence of acute infarction, hemorrhage, hydrocephalus, extra-axial collection or mass lesion/mass effect. Patchy bilateral white matter hypoattenuation is noted consistent with moderate chronic microvascular ischemic change. Probable small old lacunar infarct noted in the left centrum semiovale. Vascular: No hyperdense vessel or unexpected calcification. Skull: Normal. Negative for fracture or focal lesion. Sinuses/Orbits: Globes and orbits are unremarkable. Visualized sinuses and mastoid air cells are clear. Other: None. IMPRESSION: 1. No acute intracranial abnormalities. 2. Moderate chronic microvascular ischemic change. Probable old left centrum semiovale deep white matter lacunar infarct. Electronically Signed   By: Amie Portlandavid  Ormond M.D.   On: 12/18/2019 13:42   MR ANGIO HEAD WO CONTRAST  Result Date: 12/18/2019 CLINICAL DATA:  Stroke.  Left-sided weakness. EXAM: MRI HEAD WITHOUT CONTRAST MRA HEAD WITHOUT CONTRAST TECHNIQUE: Multiplanar, multiecho pulse sequences of the brain and surrounding structures were obtained without intravenous contrast. Angiographic images of the head were obtained using MRA technique without contrast. COMPARISON:  CT head 12/18/2019 FINDINGS: MRI HEAD FINDINGS Brain:  Acute infarct right parietal deep white matter measuring approximately 10 x 15 mm. No other acute infarct. Moderate chronic microvascular ischemic changes throughout the white matter. Mild chronic ischemia in the pons and thalamus bilaterally. Negative for hemorrhage or mass. Ventricle size normal. Vascular: Normal arterial flow voids Skull and upper cervical spine: No focal skeletal lesion. Sinuses/Orbits: Negative  Other: None MRA HEAD FINDINGS Right vertebral dominant and widely patent. Non dominant left vertebral artery with moderately severe stenosis distal to left PICA. Left PICA patent. Small right PICA patent. AICA and superior cerebellar arteries patent bilaterally. Fetal origin right posterior cerebral artery. Mild stenosis distal left posterior cerebral artery. Internal carotid artery patent bilaterally without stenosis or aneurysm. Anterior and middle cerebral arteries patent bilaterally. IMPRESSION: 1. Acute infarct in the right parietal deep white matter. 2. Moderate chronic microvascular ischemic change 3. Moderate stenosis distal left vertebral artery. No intracranial large vessel occlusion. Electronically Signed   By: Marlan Palau M.D.   On: 12/18/2019 18:24   MR BRAIN WO CONTRAST  Result Date: 12/18/2019 CLINICAL DATA:  Stroke.  Left-sided weakness. EXAM: MRI HEAD WITHOUT CONTRAST MRA HEAD WITHOUT CONTRAST TECHNIQUE: Multiplanar, multiecho pulse sequences of the brain and surrounding structures were obtained without intravenous contrast. Angiographic images of the head were obtained using MRA technique without contrast. COMPARISON:  CT head 12/18/2019 FINDINGS: MRI HEAD FINDINGS Brain: Acute infarct right parietal deep white matter measuring approximately 10 x 15 mm. No other acute infarct. Moderate chronic microvascular ischemic changes throughout the white matter. Mild chronic ischemia in the pons and thalamus bilaterally. Negative for hemorrhage or mass. Ventricle size normal. Vascular: Normal arterial flow voids Skull and upper cervical spine: No focal skeletal lesion. Sinuses/Orbits: Negative Other: None MRA HEAD FINDINGS Right vertebral dominant and widely patent. Non dominant left vertebral artery with moderately severe stenosis distal to left PICA. Left PICA patent. Small right PICA patent. AICA and superior cerebellar arteries patent bilaterally. Fetal origin right posterior cerebral artery. Mild  stenosis distal left posterior cerebral artery. Internal carotid artery patent bilaterally without stenosis or aneurysm. Anterior and middle cerebral arteries patent bilaterally. IMPRESSION: 1. Acute infarct in the right parietal deep white matter. 2. Moderate chronic microvascular ischemic change 3. Moderate stenosis distal left vertebral artery. No intracranial large vessel occlusion. Electronically Signed   By: Marlan Palau M.D.   On: 12/18/2019 18:24   ECHOCARDIOGRAM COMPLETE  Result Date: 12/19/2019   ECHOCARDIOGRAM REPORT   Patient Name:   LARENA OHNEMUS Crass Date of Exam: 12/19/2019 Medical Rec #:  [id number]         Height:       66.0 in Accession #:    [id number]        Weight:       235.7 lb Date of Birth:  09-Jul-1951          BSA:          2.14 m Patient Age:    68 years          BP:           160/79 mmHg Patient Gender: F                 HR:           61 bpm. Exam Location:  Inpatient Procedure: 2D Echo Indications:    Stroke 434.91/I163.9  History:        Patient has no prior history of Echocardiogram examinations.  Sonographer:    Ross Ludwig RDCS (AE) Referring  Phys: [id number] [facility] Jalynn Waddell IMPRESSIONS  1. Left ventricular ejection fraction, by visual estimation, is 60 to 65%. The left ventricle has normal function. Left ventricular septal wall thickness was moderately increased. Mildly increased left ventricular posterior wall thickness. There is moderately increased left ventricular hypertrophy.  2. Left ventricular diastolic parameters are consistent with Grade I diastolic dysfunction (impaired relaxation).  3. The left ventricle has no regional wall motion abnormalities.  4. Global right ventricle has normal systolic function.The right ventricular size is normal. No increase in right ventricular wall thickness.  5. Left atrial size was normal.  6. Right atrial size was normal.  7. The mitral valve is normal in structure. No evidence of mitral valve regurgitation. No evidence of mitral stenosis.  8.  The tricuspid valve is normal in structure.  9. The aortic valve is tricuspid. Aortic valve regurgitation is not visualized. No evidence of aortic valve sclerosis or stenosis. 10. The pulmonic valve was normal in structure. Pulmonic valve regurgitation is not visualized. 11. Mildly elevated pulmonary artery systolic pressure. 12. The inferior vena cava is normal in size with greater than 50% respiratory variability, suggesting right atrial pressure of 3 mmHg. FINDINGS  Left Ventricle: Left ventricular ejection fraction, by visual estimation, is 60 to 65%. The left ventricle has normal function. The left ventricle has no regional wall motion abnormalities. Mildly increased left ventricular posterior wall thickness. There is moderately increased left ventricular hypertrophy. Left ventricular diastolic parameters are consistent with Grade I diastolic dysfunction (impaired relaxation). Indeterminate filling pressures. Right Ventricle: The right ventricular size is normal. No increase in right ventricular wall thickness. Global RV systolic function is has normal systolic function. The tricuspid regurgitant velocity is 2.69 m/s, and with an assumed right atrial pressure  of 3 mmHg, the estimated right ventricular systolic pressure is mildly elevated at 31.9 mmHg. Left Atrium: Left atrial size was normal in size. Right Atrium: Right atrial size was normal in size Pericardium: There is no evidence of pericardial effusion. Mitral Valve: The mitral valve is normal in structure. No evidence of mitral valve regurgitation. No evidence of mitral valve stenosis by observation. MV peak gradient, 3.9 mmHg. Tricuspid Valve: The tricuspid valve is normal in structure. Tricuspid valve regurgitation is mild. Aortic Valve: The aortic valve is tricuspid. Aortic valve regurgitation is not visualized. The aortic valve is structurally normal, with no evidence of sclerosis or stenosis. Aortic valve mean gradient measures 3.0 mmHg. Aortic  valve peak gradient measures 7.1 mmHg. Aortic valve area, by VTI measures 2.35 cm. Pulmonic Valve: The pulmonic valve was normal in structure. Pulmonic valve regurgitation is not visualized. Pulmonic regurgitation is not visualized. Aorta: The aortic root, ascending aorta and aortic arch are all structurally normal, with no evidence of dilitation or obstruction. Venous: The inferior vena cava is normal in size with greater than 50% respiratory variability, suggesting right atrial pressure of 3 mmHg. IAS/Shunts: No atrial level shunt detected by color flow Doppler. There is no evidence of a patent foramen ovale. No ventricular septal defect is seen or detected. There is no evidence of an atrial septal defect.  LEFT VENTRICLE PLAX 2D LVIDd:         3.62 cm  Diastology LVIDs:         2.49 cm  LV e' lateral:   8.59 cm/s LV PW:         1.19 cm  LV E/e' lateral: 8.0 LV IVS:        1.40 cm  LV e' medial:  5.00 cm/s LVOT diam:     1.90 cm  LV E/e' medial:  13.7 LV SV:         33 ml LV SV Index:   14.50 LVOT Area:     2.84 cm  RIGHT VENTRICLE             IVC RV Basal diam:  3.05 cm     IVC diam: 1.23 cm RV S prime:     15.43 cm/s TAPSE (M-mode): 2.4 cm LEFT ATRIUM             Index       RIGHT ATRIUM           Index LA diam:        3.60 cm 1.68 cm/m  RA Area:     17.80 cm LA Vol (A2C):   68.8 ml 32.09 ml/m RA Volume:   47.30 ml  22.06 ml/m LA Vol (A4C):   55.1 ml 25.70 ml/m LA Biplane Vol: 61.6 ml 28.73 ml/m  AORTIC VALVE AV Area (Vmax):    2.39 cm AV Area (Vmean):   2.37 cm AV Area (VTI):     2.35 cm AV Vmax:           133.00 cm/s AV Vmean:          86.000 cm/s AV VTI:            0.274 m AV Peak Grad:      7.1 mmHg AV Mean Grad:      3.0 mmHg LVOT Vmax:         112.00 cm/s LVOT Vmean:        71.900 cm/s LVOT VTI:          0.227 m LVOT/AV VTI ratio: 0.83  AORTA Ao Root diam: 2.70 cm Ao Asc diam:  2.90 cm MITRAL VALVE                        TRICUSPID VALVE MV Area (PHT): 4.10 cm             TR Peak grad:   28.9  mmHg MV Peak grad:  3.9 mmHg             TR Vmax:        284.00 cm/s MV Mean grad:  2.0 mmHg MV Vmax:       0.99 m/s             SHUNTS MV Vmean:      56.9 cm/s            Systemic VTI:  0.23 m MV VTI:        0.37 m               Systemic Diam: 1.90 cm MV PHT:        53.65 msec MV Decel Time: 185 msec MV E velocity: 68.60 cm/s 103 cm/s MV A velocity: 95.50 cm/s 70.3 cm/s MV E/A ratio:  0.72       1.5  Chilton Si MD Electronically signed by Chilton Si MD Signature Date/Time: 12/19/2019/4:30:23 PM    Final    VAS US CAROTID  Result Date: 12/19/2019 Carotid Arterial Duplex Study Indications: CVA. Performing Technologist: Kennedy Bucker ARDMS, RVT  Examination Guidelines: A complete evaluation includes B-mode imaging, spectral Doppler, color Doppler, and power Doppler as needed of all accessible portions of each vessel. Bilateral testing is considered an integral part of a complete examination. Limited  examinations for reoccurring indications may be performed as noted.  Right Carotid Findings: +----------+--------+--------+--------+------------------+--------+           PSV cm/sEDV cm/sStenosisPlaque DescriptionComments +----------+--------+--------+--------+------------------+--------+ CCA Prox  83      14                                         +----------+--------+--------+--------+------------------+--------+ CCA Distal82      17                                         +----------+--------+--------+--------+------------------+--------+ ICA Prox  61      13      1-39%   irregular                  +----------+--------+--------+--------+------------------+--------+ ICA Distal85      27                                         +----------+--------+--------+--------+------------------+--------+ ECA       92      10                                         +----------+--------+--------+--------+------------------+--------+  +----------+--------+-------+----------------+-------------------+           PSV cm/sEDV cmsDescribe        Arm Pressure (mmHG) +----------+--------+-------+----------------+-------------------+ Subclavian132            Multiphasic, WNL                    +----------+--------+-------+----------------+-------------------+ +---------+--------+--+--------+--+---------+ VertebralPSV cm/s56EDV cm/s16Antegrade +---------+--------+--+--------+--+---------+  Left Carotid Findings: +----------+--------+--------+--------+------------------+--------+           PSV cm/sEDV cm/sStenosisPlaque DescriptionComments +----------+--------+--------+--------+------------------+--------+ CCA Prox  109     17                                         +----------+--------+--------+--------+------------------+--------+ CCA Distal81      18                                         +----------+--------+--------+--------+------------------+--------+ ICA Prox  57      15      Normal                             +----------+--------+--------+--------+------------------+--------+ ICA Distal81      26                                         +----------+--------+--------+--------+------------------+--------+ ECA       98      12                                         +----------+--------+--------+--------+------------------+--------+ +----------+--------+--------+----------------+-------------------+  PSV cm/sEDV cm/sDescribe        Arm Pressure (mmHG) +----------+--------+--------+----------------+-------------------+ [id number]             Multiphasic, WNL                    +----------+--------+--------+----------------+-------------------+ +---------+--------+--+--------+--+---------+ VertebralPSV cm/s71EDV cm/s12Antegrade +---------+--------+--+--------+--+---------+  Summary: Right Carotid: Velocities in the right ICA are consistent with a 1-39% stenosis.  Left Carotid: There is no evidence of stenosis in the left ICA. Vertebrals:  Bilateral vertebral arteries demonstrate antegrade flow. Subclavians: Normal flow hemodynamics were seen in bilateral subclavian              arteries. *See table(s) above for measurements and observations.  Electronically signed by Lemar Livings MD on 12/19/2019 at 3:28:16 PM.    Final      Labs:   Basic Metabolic Panel: Recent Labs  Lab 12/18/19 1115 12/18/19 1115 12/18/19 1135 12/19/19 0344  NA 141  --  142 141  K 3.6   < > 3.6 3.6  CL 106  --  108 107  CO2 25  --   --  22  GLUCOSE 123*  --  120* 119*  BUN 14  --  18 17  CREATININE 0.64  --  0.60 0.62  CALCIUM 9.5  --   --  8.8*  MG  --   --   --  1.9   < > = values in this interval not displayed.   GFR Estimated Creatinine Clearance: 84.3 mL/min (by C-G formula based on SCr of 0.62 mg/dL). Liver Function Tests: Recent Labs  Lab 12/18/19 1115  AST 25  ALT 20  ALKPHOS 68  BILITOT 0.9  PROT 7.9  ALBUMIN 4.2   No results for input(s): LIPASE, AMYLASE in the last 168 hours. No results for input(s): AMMONIA in the last 168 hours. Coagulation profile Recent Labs  Lab 12/18/19 1115  INR 1.0    CBC: Recent Labs  Lab 12/18/19 1115 12/18/19 1135 12/19/19 0344  WBC 5.4  --  5.3  NEUTROABS 3.7  --   --   HGB 12.7 14.3 11.7*  HCT 42.2 42.0 37.4  MCV 87.7  --  85.0  PLT 269  --  236   Cardiac Enzymes: No results for input(s): CKTOTAL, CKMB, CKMBINDEX, TROPONINI in the last 168 hours. BNP: Invalid input(s): POCBNP CBG: No results for input(s): GLUCAP in the last 168 hours. D-Dimer No results for input(s): DDIMER in the last 72 hours. Hgb A1c Recent Labs    12/19/19 0344  HGBA1C 6.1*   Lipid Profile Recent Labs    12/19/19 0344  CHOL 187  HDL 55  LDLCALC 118*  TRIG 70  CHOLHDL 3.4   Thyroid function studies No results for input(s): TSH, T4TOTAL, T3FREE, THYROIDAB in the last 72 hours.  Invalid input(s): FREET3 Anemia  work up No results for input(s): VITAMINB12, FOLATE, FERRITIN, TIBC, IRON, RETICCTPCT in the last 72 hours. Microbiology Recent Results (from the past 240 hour(s))  SARS CORONAVIRUS 2 (TAT 6-24 HRS) Nasopharyngeal Nasopharyngeal Swab     Status: None   Collection Time: 12/18/19 12:33 PM   Specimen: Nasopharyngeal Swab  Result Value Ref Range Status   SARS Coronavirus 2 NEGATIVE NEGATIVE Final    Comment: (NOTE) SARS-CoV-2 target nucleic acids are NOT DETECTED. The SARS-CoV-2 RNA is generally detectable in upper and lower respiratory specimens during the acute phase of infection. Negative results do not preclude SARS-CoV-2 infection, do not rule out co-infections with other pathogens,  and should not be used as the sole basis for treatment or other patient management decisions. Negative results must be combined with clinical observations, patient history, and epidemiological information. The expected result is Negative. Fact Sheet for Patients: HairSlick.no Fact Sheet for Healthcare Providers: quierodirigir.com This test is not yet approved or cleared by the [place] FDA and  has been authorized for detection and/or diagnosis of SARS-CoV-2 by FDA under an Emergency Use Authorization (EUA). This EUA will remain  in effect (meaning this test can be used) for the duration of the COVID-19 declaration under Section 56 4(b)(1) of the Act, 21 U.S.C. section 360bbb-3(b)(1), unless the authorization is terminated or revoked sooner. Performed at [facility], [address] [place], [place] [address]    Allergies as of 12/20/2019      Reactions   Glucosamine Anaphylaxis, Shortness Of Breath, Swelling   Iodine Anaphylaxis, Shortness Of Breath, Swelling   Shellfish-derived Products Anaphylaxis, Shortness Of Breath, Swelling      Medication List    STOP taking these medications   amoxicillin 500 MG tablet Commonly known as:  AMOXIL   celecoxib 200 MG capsule Commonly known as: CELEBREX   enoxaparin 40 MG/0.4ML injection Commonly known as: LOVENOX   HYDROcodone-acetaminophen 10-325 MG tablet Commonly known as: Norco     TAKE these medications   aspirin 81 MG EC tablet Take 1 tablet (81 mg total) by mouth daily.   atorvastatin 40 MG tablet Commonly known as: LIPITOR Take 1 tablet (40 mg total) by mouth daily at 6 PM.   benazepril-hydrochlorthiazide 20-12.5 MG tablet Commonly known as: Lotensin HCT Take 1 tablet by mouth daily.   clopidogrel 75 MG tablet Commonly known as: PLAVIX Take 1 tablet (75 mg total) by mouth daily for 21 days.   DSS 100 MG Caps Take 100 mg by mouth 2 (two) times daily.   One-A-Day Womens 50+ Advantage Tabs Take 1 tablet by mouth daily with breakfast.   VITAMIN B-12 PO Take 1 tablet by mouth daily with breakfast.   Vitamin D-3 25 MCG (1000 UT) Caps Take 1,000 Units by mouth daily with breakfast.       Discharge Instructions:   Discharge Instructions    Ambulatory referral to Neurology   Complete by: As directed    Follow up with stroke clinic NP (Jessica Vanschaick or Darrol Angel, if both not available, consider Manson Allan, or Ahern) at [facility] in about 4 weeks. Thanks.   Ambulatory referral to Occupational Therapy   Complete by: As directed    Ambulatory referral to Physical Therapy   Complete by: As directed    Diet - low sodium heart healthy   Complete by: As directed    Discharge instructions   Complete by: As directed    Follow up with your primary care physician in 1 to 2 weeks for regular checkup.  Follow-up with [facility] neurology clinic in 4 weeks.  Clinic will schedule an appointment.  Continue to take medications as prescribed.  Continue physical therapy at the outpatient clinic.  Do NOT Take over-the-counter pain medication including celecoxib.  Okay to take Tylenol for pain as needed.   Increase activity slowly   Complete by: As directed          Follow-up Information    [facility] Follow up.   Specialty: Rehabilitation Why: The outpatient therapy will contact you for the first appointment Contact information: [address] [id number] mc [place] [address]  [phone]       [facility]. Schedule an appointment as soon as possible for a visit in 4 week(s).   Specialty: Neurology Contact information: [address] [place] [id number] [phone]          Time coordinating discharge: 39 minutes  Signed:  Roneka Gilpin  [facility] 12/20/2019, 10:16 AM

## 2019-12-20 NOTE — Progress Notes (Signed)
Physical Therapy Treatment Patient Details Name: [name] MRN: [id number] DOB: 04-12-1951 Today's Date: 12/20/2019    History of Present Illness [name] is a 69 y.o. female who presents with L sided weakness and numbess. MRI revealed Acute infarct in the right parietal deep white matter.PMH includes arthritis, GERD, R THA 2014.    PT Comments    Pt progressing steadily towards her physical therapy goals, ambulating hallway distances without an assistive device at a supervision level. Session focused on static and dynamic balance activities as listed below. Pt continues with decreased gait speed, dynamic balance impairments, and decreased LUE coordination. D/c plan remains appropriate.     Follow Up Recommendations  Outpatient PT     Equipment Recommendations  None recommended by PT    Recommendations for Other Services       Precautions / Restrictions Precautions Precautions: Fall Restrictions Weight Bearing Restrictions: No    Mobility  Bed Mobility Overal bed mobility: Modified Independent             General bed mobility comments: HOB elevated, use of bed rail  Transfers Overall transfer level: Needs assistance Equipment used: None Transfers: Sit to/from Stand Sit to Stand: Supervision            Ambulation/Gait Ambulation/Gait assistance: Supervision Gait Distance (Feet): 400 Feet Assistive device: None Gait Pattern/deviations: Step-through pattern;Decreased stride length;Decreased dorsiflexion - left Gait velocity: decreased   General Gait Details: Pt with improved stride length, gait speed today. Cues for reciprocal arm swing.   Stairs             Wheelchair Mobility    Modified Rankin (Stroke Patients Only) Modified Rankin (Stroke Patients Only) Pre-Morbid Rankin Score: No symptoms Modified Rankin: Moderately severe disability     Balance Overall balance assessment: Needs assistance Sitting-balance support: No  upper extremity supported;Feet unsupported Sitting balance-Leahy Scale: Good     Standing balance support: No upper extremity supported;During functional activity Standing balance-Leahy Scale: Fair                              Cognition Arousal/Alertness: Awake/alert Behavior During Therapy: WFL for tasks assessed/performed Overall Cognitive Status: Within Functional Limits for tasks assessed                                        Exercises Other Exercises Other Exercises: Static standing balance: functional reaching in all planes with LUE Other Exercises: Dynamic balance: side stepping to both directions, backwards walking, stepping over/around obstacles    General Comments        Pertinent Vitals/Pain Pain Assessment: No/denies pain    Home Living                      Prior Function            PT Goals (current goals can now be found in the care plan section) Acute Rehab PT Goals Patient Stated Goal: get back to independence Potential to Achieve Goals: Good Progress towards PT goals: Progressing toward goals    Frequency    Min 4X/week      PT Plan Current plan remains appropriate    Co-evaluation              AM-PAC PT "6 Clicks" Mobility   Outcome Measure  Help needed turning from  your back to your side while in a flat bed without using bedrails?: None Help needed moving from lying on your back to sitting on the side of a flat bed without using bedrails?: None Help needed moving to and from a bed to a chair (including a wheelchair)?: None Help needed standing up from a chair using your arms (e.g., wheelchair or bedside chair)?: None Help needed to walk in hospital room?: None Help needed climbing 3-5 steps with a railing? : A Little 6 Click Score: 23    End of Session Equipment Utilized During Treatment: Gait belt Activity Tolerance: Patient tolerated treatment well Patient left: in chair;with call  bell/phone within reach;with chair alarm set Nurse Communication: Mobility status PT Visit Diagnosis: Unsteadiness on feet (R26.81);Other abnormalities of gait and mobility (R26.89);Difficulty in walking, not elsewhere classified (R26.2);Other symptoms and signs involving the nervous system (B14.782)     Time: 9562-1308 PT Time Calculation (min) (ACUTE ONLY): 17 min  Charges:  $Therapeutic Activity: 8-22 mins                     Ellamae Sia, PT, DPT Acute Rehabilitation Services Pager [phone] Office [phone]    Willy Eddy 12/20/2019, 12:30 PM

## 2019-12-20 NOTE — TOC Transition Note (Signed)
Transition of Care [facility]) - CM/SW Discharge Note   Patient Details  Name: [name] MRN: [id number] Date of Birth: [birth year]  Transition of Care [facility]) CM/SW Contact:  Kermit Balo, RN Phone Number: 12/20/2019, 10:41 AM   Clinical Narrative:    Pt is discharging home with outpatient therapy. Information on the AVS. Pt states she has a 3 in 1 at home, so no DME needs.  Pt has transportation home.    Final next level of care: OP Rehab Barriers to Discharge: No Barriers Identified   Patient Goals and CMS Choice     Choice offered to / list presented to : Patient  Discharge Placement                       Discharge Plan and Services   Discharge Planning Services: CM Consult                                 Social Determinants of Health (SDOH) Interventions     Readmission Risk Interventions No flowsheet data found.

## 2019-12-27 ENCOUNTER — Ambulatory Visit (HOSPITAL_COMMUNITY): Payer: BC Managed Care – PPO | Admitting: Physical Therapy

## 2019-12-27 ENCOUNTER — Ambulatory Visit (HOSPITAL_COMMUNITY): Payer: Medicare Other | Admitting: Occupational Therapy

## 2019-12-27 ENCOUNTER — Ambulatory Visit (HOSPITAL_COMMUNITY): Payer: BC Managed Care – PPO | Admitting: Occupational Therapy

## 2019-12-30 ENCOUNTER — Telehealth (HOSPITAL_COMMUNITY): Payer: Self-pay | Admitting: Physical Therapy

## 2019-12-30 NOTE — Telephone Encounter (Signed)
L/m for pt to call back to r/s PT -offered tomorrow with Mardelle Matte for PT eval only. OT will be on Wed 01/01/20 as scheduled.

## 2019-12-31 ENCOUNTER — Ambulatory Visit (HOSPITAL_COMMUNITY): Payer: Medicare Other | Attending: Internal Medicine | Admitting: Physical Therapy

## 2019-12-31 ENCOUNTER — Other Ambulatory Visit: Payer: Self-pay

## 2019-12-31 ENCOUNTER — Encounter (HOSPITAL_COMMUNITY): Payer: Self-pay | Admitting: Physical Therapy

## 2019-12-31 ENCOUNTER — Other Ambulatory Visit: Payer: Self-pay | Admitting: *Deleted

## 2019-12-31 DIAGNOSIS — R2689 Other abnormalities of gait and mobility: Secondary | ICD-10-CM | POA: Diagnosis not present

## 2019-12-31 DIAGNOSIS — R278 Other lack of coordination: Secondary | ICD-10-CM | POA: Insufficient documentation

## 2019-12-31 DIAGNOSIS — R29898 Other symptoms and signs involving the musculoskeletal system: Secondary | ICD-10-CM | POA: Diagnosis present

## 2019-12-31 DIAGNOSIS — M6281 Muscle weakness (generalized): Secondary | ICD-10-CM | POA: Diagnosis present

## 2019-12-31 DIAGNOSIS — R29818 Other symptoms and signs involving the nervous system: Secondary | ICD-10-CM | POA: Insufficient documentation

## 2019-12-31 NOTE — Patient Outreach (Signed)
[facility]) Care Management  12/31/2019  [name] Nov 22, 1951 [id number]   Subjective: Telephone call to patient's home  number, no answer, left HIPAA compliant voicemail message, and requested call back.    Objective: Per KPN (Knowledge Performance Now, point of care tool) and chart review, patient hospitalized 12/18/2019 - 12/20/2019 for Acute CVA (cerebrovascular accident), with a left-sided weakness,  numbness to her left arm,  and leg.   Patient also has a history of hypertension and arthritis.     Assessment: Received [employer] PPO EMMI Stroke Red Flag Alert follow up referral on 12/31/2019.   Red Flag Alert Trigger, Day #9, patient answered yes to the following question: Feeling worse overall?  [facility] EMMI follow up pending patient contact.      Plan: RNCM will send unsuccessful outreach letter, [facility] pamphlet, handout: Know Before You Go, will call patient for 2nd telephone outreach attempt within 4 business days, [facility] EMMI follow up, and proceed with case closure, within 10 business days if no return call.      Kaitlyn Franko H. Gardiner Barefoot, BSN, CCM [facility] Care Management [facility] Telephonic CM Phone: [phone] Fax: [phone]

## 2019-12-31 NOTE — Therapy (Signed)
[facility] [address] [place], [place], [address] Phone: [phone]   Fax:  [phone]  Physical Therapy Evaluation  Patient Details  Name: [name] MRN: [id number] Date of Birth: 03-Jan-1951 Referring Provider (PT): Joycelyn Das MD   Encounter Date: 12/31/2019  PT End of Session - 12/31/19 1621    Visit Number  1    Number of Visits  12    Date for PT Re-Evaluation  02/11/20    Authorization Type  BCBS state health plan Secondary: medicare (PT/OT/ST No visit limit, no auth required)    Authorization Time Period  01/03/20-02/11/20    Authorization - Visit Number  1    Authorization - Number of Visits  10    PT Start Time  1048    PT Stop Time  1130    PT Time Calculation (min)  42 min    Equipment Utilized During Treatment  Gait belt    Activity Tolerance  Patient tolerated treatment well    Behavior During Therapy  [facility] for tasks assessed/performed       Past Medical History:  Diagnosis Date  . Arthritis   . GERD (gastroesophageal reflux disease)     Past Surgical History:  Procedure Laterality Date  . NO PAST SURGERIES    . TOTAL HIP ARTHROPLASTY Right 05/22/2013   Procedure: TOTAL HIP ARTHROPLASTY- right ;  Surgeon: Loreta Ave, MD;  Location: [facility] OR;  Service: Orthopedics;  Laterality: Right;    There were no vitals filed for this visit.   Subjective Assessment - 12/31/19 1052    Subjective  Patient is a 69 y.o. female who presents to physical therapy s/p acute CVA on December 16, 2019. She woke up and felt like she slept funny and that was causing a heaviness in her arm. She felt like she didn't have much feeling in her arm to begin with and then her leg as well. She feels limited with walking and that she has to walk very slowly because its hard to go very fast. She feels that she has to concentrate more when performing tasks. She denies pain. She denies falls. Her main goal is to "come back better than she  was before".    Limitations  Walking;House hold activities    How long can you walk comfortably?  15 minutes    Patient Stated Goals  "come back better than she was before".    Currently in Pain?  No/denies         [facility] PT Assessment - 12/31/19 0001      Assessment   Medical Diagnosis  Acute CVA    Referring Provider (PT)  Joycelyn Das MD    Onset Date/Surgical Date  12/16/19    Hand Dominance  Right    Next MD Visit  Feb. 2021    Prior Therapy  In hospital      Precautions   Precautions  None      Restrictions   Weight Bearing Restrictions  No      Balance Screen   Has the patient fallen in the past 6 months  No    Has the patient had a decrease in activity level because of a fear of falling?   No    Is the patient reluctant to leave their home because of a fear of falling?   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      Cognition   Overall Cognitive Status  Within Functional Limits for tasks assessed      Observation/Other Assessments   Observations  ambulates with no AD, slow, labored cadence    Focus on Therapeutic Outcomes (FOTO)   55% limited      Sensation   Light Touch  Appears Intact   no difference between sides when assessed     ROM / Strength   AROM / PROM / Strength  AROM;Strength      AROM   Overall AROM   Within functional limits for tasks performed      Strength   Overall Strength  Within functional limits for tasks performed    Strength Assessment Site  Hip;Knee;Ankle    Right/Left Hip  Right;Left    Right Hip Flexion  4-/5    Right Hip ABduction  4+/5    Left Hip Flexion  4/5    Left Hip ABduction  4+/5    Right/Left Knee  Right;Left    Right Knee Flexion  5/5    Right Knee Extension  5/5    Left Knee Flexion  5/5    Left Knee Extension  5/5    Right/Left Ankle  Right;Left    Right Ankle Dorsiflexion  5/5    Left Ankle Dorsiflexion  5/5       Transfers   Transfers  Sit to Stand;Stand to Sit    Sit to Stand  6: Modified independent (Device/Increase time)    Five time sit to stand comments   16.10 seconds    Stand to Sit  6: Modified independent (Device/Increase time)    Comments  slow, labored transitions, requires UE use      Ambulation/Gait   Ambulation/Gait  Yes    Ambulation/Gait Assistance  6: Modified independent (Device/Increase time)    Ambulation Distance (Feet)  300 Feet    Assistive device  None    Gait Pattern  Step-through pattern;Decreased step length - right;Decreased step length - left;Decreased stride length    Ambulation Surface  Level;Indoor    Gait Comments  2 MWT      Balance   Balance Assessed  Yes      Static Standing Balance   Static Standing - Balance Support  --   intermittent UE support   Static Standing - Level of Assistance  4: Min assist;5: Stand by assistance    Static Standing Balance -  Activities   Single Leg Stance - Right Leg;Single Leg Stance - Left Leg;Tandam Stance - Right Leg;Tandam Stance - Left Leg    Static Standing - Comment/# of Minutes  tandem stance R/L able to perform for 30 seconds with intermittent UE support, more difficult with L posterior; SLS able to perform for 10 seconds on R, 5 seconds on L after several attempts                Objective measurements completed on examination: See above findings.              PT Education - 12/31/19 1621    Education Details  Patient educated on exam findings, POC, scope of physical therapy, and planning for upcoming sessions.    Person(s) Educated  Patient    Methods  Explanation    Comprehension  Verbalized understanding       PT Short Term Goals - 12/31/19 1633      PT SHORT TERM  GOAL #1   Title  Patient will be independent with HEP in order to improve functional outcomes.    Time  3    Period  Weeks    Status  New    Target Date  01/21/20        PT Long Term Goals - 12/31/19 1634      PT  LONG TERM GOAL #1   Title  Patient will improve FOTO by at least 10% in order to demonstrate improved tolerance to activity.    Time  6    Period  Weeks    Status  New    Target Date  02/11/20      PT LONG TERM GOAL #2   Title  Patient will report at least 50% improvement in symptoms in order to improve quality of life.    Time  6    Period  Weeks    Status  New    Target Date  02/11/20      PT LONG TERM GOAL #3   Title  Patient will be able to ambulate at least 400 feet in in order to improve gait speed for ambulating in the community.    Time  6    Period  Weeks    Status  New    Target Date  02/11/20      PT LONG TERM GOAL #4   Title  Patient will improve 5xSTS time to <11.4 seconds in order to reduce the risk of falls.    Time  6    Period  Weeks    Status  New    Target Date  02/11/20             Plan - 12/31/19 1623    Clinical Impression Statement  Patient is a 69 y.o. female who presents to physical therapy s/p acute CVA affecting her left side. She presents with deficits in strength, ROM, endurance, balance, gait, and functional mobility with ADL. Patient is most limited with balance and gait and is at an increased risk for falls as indicated with performance in 5x STS, , and with balance assessment. She is having to modify and restrict ADL as indicated by FOTO score as well as subjective information and objective measures which is affecting overall participation. Patient will benefit from skilled physical therapy in order to improve function and reduce impairment.    Personal Factors and Comorbidities  Fitness;Comorbidity 1    Comorbidities  hypertension    Examination-Activity Limitations  Locomotion Level;Carry;Squat;Stairs;Transfers    Examination-Participation Restrictions  Cleaning;Volunteer;Shop;Yard Work    Stability/Clinical Decision Making  Stable/Uncomplicated    Clinical Decision Making  Low    Rehab Potential  Good    PT Frequency  2x / week     PT Duration  6 weeks    PT Treatment/Interventions  ADLs/Self Care Home Management;Aquatic Therapy;Biofeedback;Cryotherapy;Electrical Stimulation;Iontophoresis 4mg /ml Dexamethasone;Moist Heat;Traction;Ultrasound;DME Instruction;Gait training;Stair training;Functional mobility training;Therapeutic activities;Therapeutic exercise;Balance training;Neuromuscular re-education;Patient/family education;Orthotic Fit/Training;Manual techniques;Manual lymph drainage;Compression bandaging;Passive range of motion;Dry needling;Energy conservation;Taping;Vasopneumatic Device;Spinal Manipulations;Joint Manipulations    PT Next Visit Plan  Begin gait training, balance training, LE strengthening with stairs, sit to stand, hip/core strength    PT Home Exercise Plan  Initiate next session    Consulted and Agree with Plan of Care  Patient       Patient will benefit from skilled therapeutic intervention in order to improve the following deficits and impairments:  Abnormal gait, Decreased activity tolerance, Decreased balance, Decreased endurance, Decreased knowledge  of use of DME, Decreased mobility, Decreased range of motion, Decreased strength, Difficulty walking, Improper body mechanics  Visit Diagnosis: Other abnormalities of gait and mobility  Muscle weakness (generalized)  Other symptoms and signs involving the nervous system     Problem List Patient Active Problem List   Diagnosis Date Noted  . Acute CVA (cerebrovascular accident) (HCC) 12/18/2019  . Allergy or intolerance to drug 05/27/2013  . Postoperative anemia due to acute blood loss 05/27/2013  . Osteoarthritis of right hip 05/24/2013    4:38 PM, 12/31/19 Wyman Songster PT, DPT Physical Therapist at [facility]   [facility] [address] [place], [place], [address] Phone: [phone]   Fax:  [phone]  Name: [name] MRN: [id number] Date of Birth:  02-18-1951

## 2020-01-01 ENCOUNTER — Ambulatory Visit (HOSPITAL_COMMUNITY): Payer: Medicare Other | Admitting: Physical Therapy

## 2020-01-01 ENCOUNTER — Encounter (HOSPITAL_COMMUNITY): Payer: Self-pay

## 2020-01-01 ENCOUNTER — Ambulatory Visit (HOSPITAL_COMMUNITY): Payer: Medicare Other

## 2020-01-01 ENCOUNTER — Other Ambulatory Visit: Payer: Self-pay | Admitting: *Deleted

## 2020-01-01 ENCOUNTER — Encounter: Payer: Self-pay | Admitting: *Deleted

## 2020-01-01 DIAGNOSIS — R278 Other lack of coordination: Secondary | ICD-10-CM

## 2020-01-01 DIAGNOSIS — R29898 Other symptoms and signs involving the musculoskeletal system: Secondary | ICD-10-CM

## 2020-01-01 DIAGNOSIS — R2689 Other abnormalities of gait and mobility: Secondary | ICD-10-CM | POA: Diagnosis not present

## 2020-01-01 NOTE — Patient Outreach (Signed)
[facility]) Care Management  01/01/2020  [name] [birth year] [id number]   Subjective: Telephone call to patient's home number, spoke with patient, and HIPAA verified.  Discussed [facility] Care Management Medicare / BCBS PPO EMMI Stroke Red Flag Alert follow up, patient voiced understanding, and is in agreement to follow up.   Patient states she remembers receiving EMMI automated calls, she was feeling bad at the time of the call because the call was after her first physical therapy session, feels much better, and no longer feels that way.   Patient states she had a therapy session earlier today, it went great, and she is feeling pretty good.  States she is currently receiving outpatient physical and occupational therapy at [facility].   States she went to her hospital follow up appointment with new primary MD (Dr. Judd Lien, [place]) on 12/25/2019, and the appointment went well.  States Dr. Dione Housekeeper is no longer her primary MD because he retired, RNCM advised will send a request to have primary MD updated in chart, patient voiced understanding, and is in agreement. Patient states she has a follow up appointment with neurologist on 01/21/2020.  Patient states she is able to manage self care and has assistance as needed. Patient voices understanding of medical diagnosis and treatment plan.  States she is accessing her Medicare / BCBS benefits as needed via member services number on back of card.  Discussed patient  question regarding how to access the Covid vaccine, through [place] web site for the wait list and/ or through the state web site to verify vaccine administration locations, patient voices understanding,  states she will follow up, and is appreciative of the information.  States she has also discussed options with her primary MD and they do not have access to the vaccine at this time.  Discussed Advanced Directives, advised  of [place] Management Social Worker  Advanced Directives document completion benefit, patient voices understanding, and in agreement to a referral to Education officer, museum will to ask Social Worker to send [employer], and follow up on document completion.   Patient states she does not have any education material, EMMI follow up, care coordination, care management, disease monitoring, transportation, or pharmacy needs at this time.   States she is very appreciative of the follow up, is in agreement  to receive 1 additional follow up call to assess for further CM needs, after neurologist follow up visit, and is in agreement to receive [facility] EMMI follow up calls as needed.  RNCM advised patient of RNCM's contact number and patient will follow up with RNCM if any needs arise prior to next patient outreach.    Objective: Per KPN (Knowledge Performance Now, point of care tool) and chart review, patient hospitalized 12/18/2019 - 12/20/2019 for Acute CVA (cerebrovascular accident), with a left-sided weakness,  numbness to her left arm,  and leg.   Patient also has a history of hypertension and arthritis.     Assessment: Received BCBS PPO EMMI Stroke Red Flag Alert follow up referral on 12/31/2019.   Red Flag Alert Trigger, Day #9, patient answered yes to the following question: Feeling worse overall?  EMMI follow up completed and will follow up to assess further care management needs.   Will refer to Education officer, museum, will to ask Social Worker to send [employer], and follow up on document completion.      Plan: RNCM will send request to  [name] at [facility] Care Management to update patient's primary MD in chart to Dr. Leandrew Koyanagi, per patient's request.   RNCM will refer patient to Social Worker at [facility], will to ask Social Worker to send Advanced Directive packet, and follow up on document completion.   RNCM will call patient for telephone outreach attempt, within  21 business days, EMMI follow up, to assess for further CM needs, and proceed with case closure, within 10 business days if no return call.     Aditya Nastasi H. Gardiner Barefoot, BSN, CCM [facility] Care Management [facility] Telephonic CM Phone: [phone] Fax: [phone]

## 2020-01-01 NOTE — Therapy (Signed)
[facility] [address] [place], [place], [address] Phone: [phone]   Fax:  [phone]  Occupational Therapy Evaluation  Patient Details  Name: [name] MRN: [id number] Date of Birth: 10/06/1951 Referring Provider (OT): Joycelyn Das MD   Encounter Date: 01/01/2020  OT End of Session - 01/01/20 0932    Visit Number  1    Number of Visits  8    Date for OT Re-Evaluation  01/29/20    Authorization Type  1) BCBS State 2) Medicare    Authorization Time Period  no visit limit. Progress note at visit 10    Authorization - Visit Number  1    Authorization - Number of Visits  10    OT Start Time  0815    OT Stop Time  0845    OT Time Calculation (min)  30 min    Activity Tolerance  Patient tolerated treatment well    Behavior During Therapy  [facility] for tasks assessed/performed       Past Medical History:  Diagnosis Date  . Arthritis   . GERD (gastroesophageal reflux disease)     Past Surgical History:  Procedure Laterality Date  . NO PAST SURGERIES    . TOTAL HIP ARTHROPLASTY Right 05/22/2013   Procedure: TOTAL HIP ARTHROPLASTY- right ;  Surgeon: Loreta Ave, MD;  Location: [facility] OR;  Service: Orthopedics;  Laterality: Right;    There were no vitals filed for this visit.  Subjective Assessment - 01/01/20 0819    Subjective   S: I noticed the other day that my sensation was back in my hand. I hadn't really noticed that it was decreased.    Pertinent History  Patient is a 69 y/o female S/P acute CVA which was sustained on 12/16/19 causing left side weakness. Patient received Acute OT and PT services while hospitalized briefly. Dr. Rebekah Chesterfield has referred patient to occupational therapy for evaluation and treatment.    Patient Stated Goals  None stated.    Currently in Pain?  No/denies        [facility] OT Assessment - 01/01/20 0820      Assessment   Medical Diagnosis  Acute CVA    Referring Provider (OT)  Joycelyn Das MD     Onset Date/Surgical Date  12/16/19    Hand Dominance  Right    Next MD Visit  01/21/20    Prior Therapy  Acute OT only      Precautions   Precautions  None      Restrictions   Weight Bearing Restrictions  No      Balance Screen   Has the patient fallen in the past 6 months  No      Home  Environment   Family/patient expects to be discharged to:  Private residence    Living Arrangements  Spouse/significant other   Son     Prior Function   Level of Independence  Independent    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       ADL   ADL comments  Patient reports some mild weakness in her LUE. She is able to complete all ADL tasks independently at home. She does not use any DME.      Mobility   Mobility Status  Independent      Written Expression   Dominant Hand  Left      Vision - History   Baseline Vision  Wears glasses only for reading      Cognition   Overall Cognitive Status  Within Functional Limits for tasks assessed      Sensation   Light Touch  Appears Intact      Coordination   9 Hole Peg Test  Left;Right    Right 9 Hole Peg Test  21.9"    Left 9 Hole Peg Test  25.8"      ROM / Strength   AROM / PROM / Strength  AROM;Strength      AROM   Overall AROM   Within functional limits for tasks performed    Overall AROM Comments  LUE shoulder, elbow, wrist and hands ranges.      Strength   Overall Strength Comments  Assessed seated. IR/er adducted    Strength Assessment Site  Shoulder;Elbow;Hand    Right/Left Shoulder  Left    Left Shoulder Flexion  4-/5    Left Shoulder ABduction  4-/5    Left Shoulder Internal Rotation  5/5    Left Shoulder External Rotation  4+/5    Right/Left Elbow  Left    Left Elbow Flexion  5/5    Left Elbow Extension  5/5    Right/Left hand  Right;Left    Right Hand Grip (lbs)  65    Right Hand Lateral Pinch  16 lbs     Right Hand 3 Point Pinch  18 lbs    Left Hand Grip (lbs)  25    Left Hand Lateral Pinch  14 lbs    Left Hand 3 Point Pinch  12 lbs                      OT Education - 01/01/20 0928    Education Details  red theraputty - grip and pinch strengthening. Shoulder endurance A/ROM exercises. Motor control exercises    Person(s) Educated  Patient    Methods  Explanation;Demonstration;Handout;Verbal cues    Comprehension  Verbalized understanding       OT Short Term Goals - 01/01/20 1103      OT SHORT TERM GOAL #1   Title  Patient will be educated and independent with HEP in order to faciliate her progress in therapy and allow her to increase her use of her LUE during daily and leisure tasks.    Time  4    Period  Weeks    Status  New    Target Date  01/29/20      OT SHORT TERM GOAL #2   Title  Patient will increase her left hand grip strength by 10# and her pinch strength by 4# in order to increase her ability to hold onto weighted household items without dropping.    Time  4    Period  Weeks    Status  New      OT SHORT TERM GOAL #3   Title  Patient will increase her left hand coordination by completing the 9 hole peg test in 23" or less.    Time  4    Period  Weeks    Status  New      OT SHORT TERM GOAL #4   Title  Patient will increase her LUE shoulder strength to 4+/5 in order to complete normal household lifting activities with less difficulty.    Time  4    Period  Weeks  Status  New               Plan - 01/01/20 0933    Clinical Impression Statement  A:Patient is a 69 y/o female S/P CVA causing left side shoulder weakness, decreased hand strength and coordination resulting in difficulty completing daily using her LUE as her nondominant extremity.    OT Occupational Profile and History  Problem Focused Assessment - Including review of records relating to presenting problem    Occupational performance deficits (Please refer to evaluation for  details):  ADL's;IADL's    Body Structure / Function / Physical Skills  ADL;UE functional use;FMC;Coordination;GMC;Strength;IADL    Rehab Potential  Excellent    Clinical Decision Making  Limited treatment options, no task modification necessary    Comorbidities Affecting Occupational Performance:  None    Modification or Assistance to Complete Evaluation   No modification of tasks or assist necessary to complete eval    OT Frequency  2x / week    OT Duration  4 weeks    OT Treatment/Interventions  Self-care/ADL training;Ultrasound;DME and/or AE instruction;Patient/family education;Electrical Stimulation;Neuromuscular education;Therapeutic activities;Therapeutic exercise;Manual Therapy    Plan  P: Patient will benefit from skilled OT services to increase functional use of her LUE with all daily tasks. Treatment Plan: Shoulder strengthening, grip and pinch strength. gross motor coordination to work on shoulder endurance. Fine motor coordination.    Consulted and Agree with Plan of Care  Patient       Patient will benefit from skilled therapeutic intervention in order to improve the following deficits and impairments:   Body Structure / Function / Physical Skills: ADL, UE functional use, FMC, Coordination, GMC, Strength, IADL       Visit Diagnosis: Other symptoms and signs involving the musculoskeletal system - Plan: Ot plan of care cert/re-cert  Other lack of coordination - Plan: Ot plan of care cert/re-cert    Problem List Patient Active Problem List   Diagnosis Date Noted  . Acute CVA (cerebrovascular accident) (HCC) 12/18/2019  . Allergy or intolerance to drug 05/27/2013  . Postoperative anemia due to acute blood loss 05/27/2013  . Osteoarthritis of right hip 05/24/2013   [name], OTR/L,CBIS  [phone]  01/01/2020, 11:10 AM  [facility] [address] [place], [place], [address] Phone: [phone]   Fax:   [phone]  Name: CYNITHA BERTE MRN: [id number] Date of Birth: 01/18/1951

## 2020-01-01 NOTE — Patient Instructions (Signed)
Home Exercises Program Theraputty Exercises  Do the following exercises 2 times a day using your affected hand.  1. Roll putty into a ball.  2. Make into a pancake.  3. Roll putty into a roll.  4. Pinch along log with first finger and thumb.   5. Make into a ball.  6. Roll it back into a log.   7. Pinch using thumb and side of first finger.  8. Roll into a ball, then flatten into a pancake.  9. Using your fingers, make putty into a mountain.  10. Roll putty into a ball and squeeze and release 10 times.  Occupational Therapy Arm Exercises     Both arms straight out in front of you, keep your elbows straight do not let them bend, the criss/cross your arms in front of you. 10 times.  Both arms straight out in front of you, keep elbows straight.  Bend your wrist back and make small circles in the air.  Go in one direction the go in the other (Like you are waxing something) 10 times.  Both arms straight out in front of you, keep elbows straight.  Bend your wrists back and move your arm up while the left arm goes down as quick as you can. 10 times.  With your right/left arm straight out in front of you, keep your elbow straight.  Write your name in the air 3-4 times quickly.  Put both of your hands on your lap with your palms down, turn your palms up and down as fast as you can. 10 times.  Put both of your hands on your lap keeping your wrists on your lap at all times.  No pat on your lap quickly alternating right/left. 10 times.      Put your hands on your lap and drum your fingers.  Make sure your fingers are moving individually as fast as you can for 1 minute.

## 2020-01-02 ENCOUNTER — Ambulatory Visit (HOSPITAL_COMMUNITY): Payer: Medicare Other | Admitting: Occupational Therapy

## 2020-01-02 ENCOUNTER — Other Ambulatory Visit: Payer: Self-pay

## 2020-01-02 ENCOUNTER — Ambulatory Visit (HOSPITAL_COMMUNITY): Payer: Medicare Other | Admitting: Physical Therapy

## 2020-01-02 ENCOUNTER — Encounter (HOSPITAL_COMMUNITY): Payer: Self-pay | Admitting: Physical Therapy

## 2020-01-02 ENCOUNTER — Encounter (HOSPITAL_COMMUNITY): Payer: Self-pay | Admitting: Occupational Therapy

## 2020-01-02 DIAGNOSIS — M6281 Muscle weakness (generalized): Secondary | ICD-10-CM

## 2020-01-02 DIAGNOSIS — R2689 Other abnormalities of gait and mobility: Secondary | ICD-10-CM | POA: Diagnosis not present

## 2020-01-02 DIAGNOSIS — R29898 Other symptoms and signs involving the musculoskeletal system: Secondary | ICD-10-CM

## 2020-01-02 DIAGNOSIS — R278 Other lack of coordination: Secondary | ICD-10-CM

## 2020-01-02 DIAGNOSIS — R29818 Other symptoms and signs involving the nervous system: Secondary | ICD-10-CM

## 2020-01-02 NOTE — Therapy (Signed)
[facility] [place], [place], [address] Phone: [phone]   Fax:  [phone]  Physical Therapy Treatment  Patient Details  Name: [name] MRN: [id number] Date of Birth: 08-28-1951 Referring Provider (PT): Flora Lipps MD   Encounter Date: 01/02/2020  PT End of Session - 01/02/20 0906    Visit Number  2    Number of Visits  12    Date for PT Re-Evaluation  02/11/20    Authorization Type  BCBS state health plan Secondary: medicare (PT/OT/ST No visit limit, no auth required)    Authorization Time Period  01/03/20-02/11/20    Authorization - Visit Number  2    Authorization - Number of Visits  10    PT Start Time  0902    PT Stop Time  0942    PT Time Calculation (min)  40 min    Equipment Utilized During Treatment  Gait belt    Activity Tolerance  Patient tolerated treatment well    Behavior During Therapy  [facility] for tasks assessed/performed       Past Medical History:  Diagnosis Date  . Arthritis   . GERD (gastroesophageal reflux disease)     Past Surgical History:  Procedure Laterality Date  . NO PAST SURGERIES    . TOTAL HIP ARTHROPLASTY Right 05/22/2013   Procedure: TOTAL HIP ARTHROPLASTY- right ;  Surgeon: Ninetta Lights, MD;  Location: [place];  Service: Orthopedics;  Laterality: Right;    There were no vitals filed for this visit.  Subjective Assessment - 01/02/20 0906    Subjective  Patient reports no new issues since IE on 12/31/19. Says she has been doing some walking and things around the house. Denies current pain.    Limitations  Walking;House hold activities    How long can you walk comfortably?  15 minutes    Patient Stated Goals  "come back better than she was before".    Currently in Pain?  No/denies                       [facility] Adult PT Treatment/Exercise - 01/02/20 0001      Exercises   Exercises  Knee/Hip      Knee/Hip Exercises: Standing   Heel Raises  Both;20 reps     Hip Abduction  Both;15 reps    Hip Extension  Both;15 reps    Lateral Step Up  Both;1 set;10 reps;Hand Hold: 1;Step Height: 4"    Forward Step Up  Both;1 set;10 reps;Hand Hold: 1;Step Height: 4"    SLS  3 x10" solid floor, intermittent HHA    Gait Training  226 feet with no AD    Other Standing Knee Exercises  tandem stance, solid floor, 3 x 20"    Other Standing Knee Exercises  sidestepping, 15', 2RT; tandem gait, 15', 2RT      Knee/Hip Exercises: Seated   Sit to Sand  10 reps;without UE support             PT Education - 01/02/20 0942    Education Details  Patient educated on exercise technique and HEP    Person(s) Educated  Patient    Methods  Explanation;Handout    Comprehension  Verbalized understanding       PT Short Term Goals - 12/31/19 1633      PT SHORT TERM GOAL #1   Title  Patient will be independent with HEP in order to  improve functional outcomes.    Time  3    Period  Weeks    Status  New    Target Date  01/21/20        PT Long Term Goals - 12/31/19 1634      PT LONG TERM GOAL #1   Title  Patient will improve FOTO by at least 10% in order to demonstrate improved tolerance to activity.    Time  6    Period  Weeks    Status  New    Target Date  02/11/20      PT LONG TERM GOAL #2   Title  Patient will report at least 50% improvement in symptoms in order to improve quality of life.    Time  6    Period  Weeks    Status  New    Target Date  02/11/20      PT LONG TERM GOAL #3   Title  Patient will be able to ambulate at least 400 feet in in order to improve gait speed for ambulating in the community.    Time  6    Period  Weeks    Status  New    Target Date  02/11/20      PT LONG TERM GOAL #4   Title  Patient will improve 5xSTS time to <11.4 seconds in order to reduce the risk of falls.    Time  6    Period  Weeks    Status  New    Target Date  02/11/20            Plan - 01/02/20 0951    Clinical Impression Statement   Patient did very well with initial visit. Ther ex initiated today. Reviewed patient goals, and plan. Began gait, balance and BLE strengthening program. Patient educated on and issued HEP handout. Patient required verbal cueing and demo for most new exercise, but cued specifically on keeping feet straight with standing hip abduction and sidestepping, as well as maintaining good upright posturing during balance training. Discussed gait deficits with patient, as noted slight decrease in LT stance and mild RT Trendelenburg, but overall did well and with good stability using no AD. Patient reports mild muscle fatigue post session, no increase in pain.    Personal Factors and Comorbidities  Fitness;Comorbidity 1    Comorbidities  hypertension    Examination-Activity Limitations  Locomotion Level;Carry;Squat;Stairs;Transfers    Examination-Participation Restrictions  Cleaning;Volunteer;Shop;Yard Work    Stability/Clinical Decision Making  Stable/Uncomplicated    Rehab Potential  Good    PT Frequency  2x / week    PT Duration  6 weeks    PT Treatment/Interventions  ADLs/Self Care Home Management;Aquatic Therapy;Biofeedback;Cryotherapy;Electrical Stimulation;Iontophoresis 4mg /ml Dexamethasone;Moist Heat;Traction;Ultrasound;DME Instruction;Gait training;Stair training;Functional mobility training;Therapeutic activities;Therapeutic exercise;Balance training;Neuromuscular re-education;Patient/family education;Orthotic Fit/Training;Manual techniques;Manual lymph drainage;Compression bandaging;Passive range of motion;Dry needling;Energy conservation;Taping;Vasopneumatic Device;Spinal Manipulations;Joint Manipulations    PT Next Visit Plan  Continue to progress gait, balance and functional BLE strengthening as tolerated. Increase step height, add foam to balance next visit, possibly try SLS with vectors if able.    PT Home Exercise Plan  01/02/20: sit to stand, heel raises, tandem stance at counter    Consulted and  Agree with Plan of Care  Patient       Patient will benefit from skilled therapeutic intervention in order to improve the following deficits and impairments:  Abnormal gait, Decreased activity tolerance, Decreased balance, Decreased endurance, Decreased knowledge of use of DME, Decreased  mobility, Decreased range of motion, Decreased strength, Difficulty walking, Improper body mechanics  Visit Diagnosis: Other abnormalities of gait and mobility  Muscle weakness (generalized)  Other symptoms and signs involving the nervous system     Problem List Patient Active Problem List   Diagnosis Date Noted  . Acute CVA (cerebrovascular accident) (HCC) 12/18/2019  . Allergy or intolerance to drug 05/27/2013  . Postoperative anemia due to acute blood loss 05/27/2013  . Osteoarthritis of right hip 05/24/2013    9:55 AM, 01/02/20 Georges Lynch PT DPT  Physical Therapist with [facility]  [facility]  [phone]   [facility] [address] [place], [place], [address] Phone: [phone]   Fax:  [phone]  Name: DANIELE DILLOW MRN: [id number] Date of Birth: 1951-05-28

## 2020-01-02 NOTE — Patient Instructions (Signed)
Access Code: [email or web address]  URL: [email or web address]  Date: 01/02/2020  Prepared by: Georges Lynch   Exercises Sit to Stand with Hands on Knees - 10 reps - 1 sets - 2x daily - 7x weekly Heel rises with counter support - 10 reps - 2 sets - 2x daily - 7x weekly Standing Tandem Balance with Counter Support - 3 reps - 1 sets - 20 hold - 2x daily - 7x weekly

## 2020-01-02 NOTE — Patient Outreach (Signed)
[facility]) Care Management  01/02/2020  LEDONNA DORMER Sep 22, 1951 [id number]   Social Work referral received from [facility], Elmer Picker to send Advance Directive documentation to patient and follow up to assist with completion.  Advance Directive Emmi and packet mailed today.  Will follow up within the next two weeks to ensure receipt and assist with completion if needed.  Malachy Chamber, BSW Social Worker [phone]

## 2020-01-02 NOTE — Therapy (Signed)
[facility] [address] [place], [place], [address] Phone: [phone]   Fax:  [phone]  Occupational Therapy Treatment  Patient Details  Name: [name] MRN: [id number] Date of Birth: [birth year] Referring Provider (OT): Joycelyn Das MD   Encounter Date: 01/02/2020  OT End of Session - 01/02/20 1028    Visit Number  2    Number of Visits  8    Date for OT Re-Evaluation  01/29/20    Authorization Type  1) BCBS State 2) Medicare    Authorization Time Period  no visit limit. Progress note at visit 10    Authorization - Visit Number  2    Authorization - Number of Visits  10    OT Start Time  0945    OT Stop Time  1025    OT Time Calculation (min)  40 min    Activity Tolerance  Patient tolerated treatment well    Behavior During Therapy  W[facility] for tasks assessed/performed       Past Medical History:  Diagnosis Date  . Arthritis   . GERD (gastroesophageal reflux disease)     Past Surgical History:  Procedure Laterality Date  . NO PAST SURGERIES    . TOTAL HIP ARTHROPLASTY Right 05/22/2013   Procedure: TOTAL HIP ARTHROPLASTY- right ;  Surgeon: Loreta Ave, MD;  Location: [facility] OR;  Service: Orthopedics;  Laterality: Right;    There were no vitals filed for this visit.  Subjective Assessment - 01/02/20 0945    Subjective   S: Getting the putty out of the container is a exercise itself.    Currently in Pain?  No/denies         [facility] OT Assessment - 01/02/20 0945      Assessment   Medical Diagnosis  Acute CVA      Precautions   Precautions  None               OT Treatments/Exercises (OP) - 01/02/20 0946      Exercises   Exercises  Hand      Hand Exercises   Hand Gripper with Large Beads  all beads gripper at 29#    Hand Gripper with Medium Beads  all beads gripper at 25#    Hand Gripper with Small Beads  all beads gripper at 20#      Neurological Re-education Exercises   Shoulder Flexion   Strengthening;10 reps   1#   Shoulder ABduction  Strengthening;10 reps   1# for 5 reps   Shoulder Protraction  Strengthening;10 reps   1#   Shoulder Horizontal ABduction  Strengthening;10 reps   1#   Shoulder External Rotation  Strengthening;10 reps   1#   Shoulder Internal Rotation  Strengthening;10 reps   1#   Other Exercises 1  proximal shoulder strengthening in sitting, 10X each, no rest breaks; UBE Level 1 2' forward 2' reverse    Other Exercises 2  pt wearing 1# wrist weight, placing all colored clothespins along vertical bar of pinch tree using 3 point pinch. Task working on LUE strengthening and pinch strength.                OT Short Term Goals - 01/02/20 1024      OT SHORT TERM GOAL #1   Title  Patient will be educated and independent with HEP in order to faciliate her progress in therapy and allow her to increase her use of her LUE during  daily and leisure tasks.    Time  4    Period  Weeks    Status  On-going    Target Date  01/29/20      OT SHORT TERM GOAL #2   Title  Patient will increase her left hand grip strength by 10# and her pinch strength by 4# in order to increase her ability to hold onto weighted household items without dropping.    Time  4    Period  Weeks    Status  On-going      OT SHORT TERM GOAL #3   Title  Patient will increase her left hand coordination by completing the 9 hole peg test in 23" or less.    Time  4    Period  Weeks    Status  On-going      OT SHORT TERM GOAL #4   Title  Patient will increase her LUE shoulder strength to 4+/5 in order to complete normal household lifting activities with less difficulty.    Time  4    Period  Weeks    Status  On-going               Plan - 01/02/20 1024    Clinical Impression Statement  A: Initiated LUE strengthening today including shoulder exercises with free weights, grip strengthening and pinch strengthening. Pt requiring occasional rest breaks during session, verbal cuing  for form and technique with exercises. Reports mod fatigue in LUE at end of session.    Body Structure / Function / Physical Skills  ADL;UE functional use;FMC;Coordination;GMC;Strength;IADL    Plan  P: Continue with LUE strengthening and add scapular theraband       Patient will benefit from skilled therapeutic intervention in order to improve the following deficits and impairments:   Body Structure / Function / Physical Skills: ADL, UE functional use, FMC, Coordination, GMC, Strength, IADL       Visit Diagnosis: Other symptoms and signs involving the musculoskeletal system  Other lack of coordination    Problem List Patient Active Problem List   Diagnosis Date Noted  . Acute CVA (cerebrovascular accident) ([place]) 12/18/2019  . Allergy or intolerance to drug 05/27/2013  . Postoperative anemia due to acute blood loss 05/27/2013  . Osteoarthritis of right hip 05/24/2013   Guadelupe Sabin, OTR/L  [phone] 01/02/2020, 10:29 AM  [place], [place], [address] Phone: [phone]   Fax:  [phone]  Name: JLYN CERROS MRN: [id number] Date of Birth: Dec 22, 1950

## 2020-01-06 ENCOUNTER — Encounter (HOSPITAL_COMMUNITY): Payer: BC Managed Care – PPO | Admitting: Physical Therapy

## 2020-01-08 ENCOUNTER — Encounter (HOSPITAL_COMMUNITY): Payer: Self-pay

## 2020-01-08 ENCOUNTER — Ambulatory Visit (HOSPITAL_COMMUNITY): Payer: Medicare Other

## 2020-01-08 ENCOUNTER — Encounter (HOSPITAL_COMMUNITY): Payer: Self-pay | Admitting: Physical Therapy

## 2020-01-08 ENCOUNTER — Telehealth (HOSPITAL_COMMUNITY): Payer: Self-pay

## 2020-01-08 ENCOUNTER — Ambulatory Visit (HOSPITAL_COMMUNITY): Payer: Medicare Other | Attending: Internal Medicine | Admitting: Physical Therapy

## 2020-01-08 ENCOUNTER — Other Ambulatory Visit: Payer: Self-pay

## 2020-01-08 DIAGNOSIS — R2689 Other abnormalities of gait and mobility: Secondary | ICD-10-CM

## 2020-01-08 DIAGNOSIS — M6281 Muscle weakness (generalized): Secondary | ICD-10-CM

## 2020-01-08 DIAGNOSIS — R278 Other lack of coordination: Secondary | ICD-10-CM | POA: Insufficient documentation

## 2020-01-08 DIAGNOSIS — R29898 Other symptoms and signs involving the musculoskeletal system: Secondary | ICD-10-CM

## 2020-01-08 DIAGNOSIS — R29818 Other symptoms and signs involving the nervous system: Secondary | ICD-10-CM

## 2020-01-08 NOTE — Telephone Encounter (Signed)
pt called to cancel her friday appts due to she will be getting the covid vaccine 

## 2020-01-08 NOTE — Therapy (Signed)
[place], [place], [address] Phone: [phone]   Fax:  [phone]  Occupational Therapy Treatment  Patient Details  Name: [name] MRN: [id number] Date of Birth: Aug 20, 1951 Referring Provider (OT): Flora Lipps MD   Encounter Date: 01/08/2020  OT End of Session - 01/08/20 1521    Visit Number  3    Number of Visits  8    Date for OT Re-Evaluation  01/29/20    Authorization Type  1) BCBS State 2) Medicare    Authorization Time Period  no visit limit. Progress note at visit 10    Authorization - Visit Number  3    Authorization - Number of Visits  10    OT Start Time  1430    OT Stop Time  1510    OT Time Calculation (min)  40 min    Activity Tolerance  Patient tolerated treatment well    Behavior During Therapy  WFL for tasks assessed/performed       Past Medical History:  Diagnosis Date  . Arthritis   . GERD (gastroesophageal reflux disease)     Past Surgical History:  Procedure Laterality Date  . NO PAST SURGERIES    . TOTAL HIP ARTHROPLASTY Right 05/22/2013   Procedure: TOTAL HIP ARTHROPLASTY- right ;  Surgeon: Ninetta Lights, MD;  Location: [facility];  Service: Orthopedics;  Laterality: Right;    There were no vitals filed for this visit.  Subjective Assessment - 01/08/20 1432    Subjective   S: That bike was challenging last time.    Currently in Pain?  No/denies         [facility] OT Assessment - 01/08/20 1433      Assessment   Medical Diagnosis  Acute CVA      Precautions   Precautions  None               OT Treatments/Exercises (OP) - 01/08/20 1433      Exercises   Exercises  Shoulder;Hand      Shoulder Exercises: Standing   Extension  Theraband;10 reps    Theraband Level (Shoulder Extension)  Level 2 (Red)    Row  Theraband;10 reps    Theraband Level (Shoulder Row)  Level 2 (Red)    Retraction  Theraband;10 reps    Theraband Level (Shoulder Retraction)  Level  2 (Red)      Shoulder Exercises: Therapy Ball   Other Therapy Ball Exercises  green therapy ball: chest press, flexion, circles 10X      Shoulder Exercises: ROM/Strengthening   UBE (Upper Arm Bike)  Level 1 2' forward 2' reverse   pace: 5.0-6.0   Over Head Lace  seated 2'      Hand Exercises   Sponges  Uilized green resistive clothespin and a 3 point pinch to pick up 25 sponges and place iin container on an elevated surface.       Fine Motor Coordination (Hand/Wrist)   Fine Motor Coordination  Grooved pegs;Flipping cards;Dealing card with thumb    Flipping cards  Flipped cards one at a time to focus on coordination and motor speed.     Dealing card with thumb  pt dealt a deck of cards with her left hand with min difficulty.     Grooved pegs  Picked up 5 pegs at a time prior to placing into pegboard while focusing on in hand manipulation and coordination.  OT Short Term Goals - 01/02/20 1024      OT SHORT TERM GOAL #1   Title  Patient will be educated and independent with HEP in order to faciliate her progress in therapy and allow her to increase her use of her LUE during daily and leisure tasks.    Time  4    Period  Weeks    Status  On-going    Target Date  01/29/20      OT SHORT TERM GOAL #2   Title  Patient will increase her left hand grip strength by 10# and her pinch strength by 4# in order to increase her ability to hold onto weighted household items without dropping.    Time  4    Period  Weeks    Status  On-going      OT SHORT TERM GOAL #3   Title  Patient will increase her left hand coordination by completing the 9 hole peg test in 23" or less.    Time  4    Period  Weeks    Status  On-going      OT SHORT TERM GOAL #4   Title  Patient will increase her LUE shoulder strength to 4+/5 in order to complete normal household lifting activities with less difficulty.    Time  4    Period  Weeks    Status  On-going               Plan -  01/08/20 1522    Clinical Impression Statement  A: Began session focusing on shoulder strengthening while then transitioning to table for hand strength and coordination. patient continues to have decreased shoulder and scapular strengthening especially when required to hold her arm at or above shoulder level for a period of time. Several rest breaks were taken as needed during session. VC for form and technique were provided. No difficulty noted with manipulating deck of cards although once presented with the grooved pegboard, she had min-mod difficulty keeping pegs from dropping from her hands during task.    Body Structure / Function / Physical Skills  ADL;UE functional use;FMC;Coordination;GMC;Strength;IADL    Plan  P: Provide HEP for LUE strengthening.    Consulted and Agree with Plan of Care  Patient       Patient will benefit from skilled therapeutic intervention in order to improve the following deficits and impairments:   Body Structure / Function / Physical Skills: ADL, UE functional use, FMC, Coordination, GMC, Strength, IADL       Visit Diagnosis: Other symptoms and signs involving the nervous system  Other lack of coordination  Other symptoms and signs involving the musculoskeletal system    Problem List Patient Active Problem List   Diagnosis Date Noted  . Acute CVA (cerebrovascular accident) (HCC) 12/18/2019  . Allergy or intolerance to drug 05/27/2013  . Postoperative anemia due to acute blood loss 05/27/2013  . Osteoarthritis of right hip 05/24/2013   [name], OTR/L,CBIS  [phone]  01/08/2020, 3:25 PM  [facility] [address] [place], [place], [address] Phone: [phone]   Fax:  [phone]  Name: [name] MRN: [id number] Date of Birth: [birth year]

## 2020-01-08 NOTE — Therapy (Signed)
[facility] [address] [place], [place], [address] Phone: [phone]   Fax:  [phone]  Physical Therapy Treatment  Patient Details  Name: [name] MRN: [id number] Date of Birth: [birth year] Referring Provider (PT): Joycelyn Das MD   Encounter Date: 01/08/2020  PT End of Session - 01/08/20 1357    Visit Number  3    Number of Visits  12    Date for PT Re-Evaluation  02/11/20    Authorization Type  BCBS state health plan Secondary: medicare (PT/OT/ST No visit limit, no auth required)    Authorization Time Period  01/03/20-02/11/20    Authorization - Visit Number  3    Authorization - Number of Visits  10    PT Start Time  1351    PT Stop Time  1429    PT Time Calculation (min)  38 min    Equipment Utilized During Treatment  Gait belt    Activity Tolerance  Patient tolerated treatment well    Behavior During Therapy  [facility] for tasks assessed/performed       Past Medical History:  Diagnosis Date  . Arthritis   . GERD (gastroesophageal reflux disease)     Past Surgical History:  Procedure Laterality Date  . NO PAST SURGERIES    . TOTAL HIP ARTHROPLASTY Right 05/22/2013   Procedure: TOTAL HIP ARTHROPLASTY- right ;  Surgeon: Loreta Ave, MD;  Location: [facility] OR;  Service: Orthopedics;  Laterality: Right;    There were no vitals filed for this visit.  Subjective Assessment - 01/08/20 1355    Subjective  Patient reports that she has not had any falls. She has been able to do all of her exercises. She feels that she is getting stronger and getting better. She has been able to do more around the house.    Limitations  Walking;House hold activities    How long can you walk comfortably?  15 minutes    Patient Stated Goals  "come back better than she was before".    Currently in Pain?  No/denies                       [facility] Adult PT Treatment/Exercise - 01/08/20 0001      Knee/Hip Exercises: Standing   Heel  Raises  Both;20 reps    Hip Abduction  Both;15 reps    Hip Extension  Both;15 reps    Forward Step Up  Both;10 reps;Hand Hold: 1;Step Height: 6";2 sets    SLS  3 x10" solid floor, intermittent HHA    Gait Training  226 feet with no AD    Other Standing Knee Exercises  tandem stance, solid floor, 3 x 30" --> airex 2x20 seconds with each leg posterior    Other Standing Knee Exercises  sidestepping, 15', 3RT; tandem gait, 15', 2RT      Knee/Hip Exercises: Seated   Sit to Sand  10 reps;without UE support   2 sets            PT Education - 01/08/20 1357    Education Details  Patient educated on exercise technique and HEP    Person(s) Educated  Patient    Methods  Explanation    Comprehension  Verbalized understanding       PT Short Term Goals - 01/08/20 1415      PT SHORT TERM GOAL #1   Title  Patient will be independent with HEP in order  to improve functional outcomes.    Time  3    Period  Weeks    Status  On-going    Target Date  01/21/20        PT Long Term Goals - 01/08/20 1415      PT LONG TERM GOAL #1   Title  Patient will improve FOTO by at least 10% in order to demonstrate improved tolerance to activity.    Time  6    Period  Weeks    Status  On-going      PT LONG TERM GOAL #2   Title  Patient will report at least 50% improvement in symptoms in order to improve quality of life.    Time  6    Period  Weeks    Status  On-going      PT LONG TERM GOAL #3   Title  Patient will be able to ambulate at least 400 feet in in order to improve gait speed for ambulating in the community.    Time  6    Period  Weeks    Status  On-going      PT LONG TERM GOAL #4   Title  Patient will improve 5xSTS time to <11.4 seconds in order to reduce the risk of falls.    Time  6    Period  Weeks    Status  On-going            Plan - 01/08/20 1414    Clinical Impression Statement  Patient able to progress to increased step height today but requires verbal  cueing for eccentric control. She is able to complete tandem stance on solid ground with moderate difficulty but minimal sway. She demonstrates moderate sway when adding compliant surface and requires intermittent UE support.  She ambulates well without AD and is safe without loss of balance. She is able to complete more reps of sit to stand today with verbal cueing for slow controlled eccentric phase. She is able to perform lateral stepping with mini squat throughout today for increased proximal muscle activation. Patient will continue to benefit from skilled physical therapy in order to improve function and reduce impairment.    Personal Factors and Comorbidities  Fitness;Comorbidity 1    Comorbidities  hypertension    Examination-Activity Limitations  Locomotion Level;Carry;Squat;Stairs;Transfers    Examination-Participation Restrictions  Cleaning;Volunteer;Shop;Yard Work    Stability/Clinical Decision Making  Stable/Uncomplicated    Rehab Potential  Good    PT Frequency  2x / week    PT Duration  6 weeks    PT Treatment/Interventions  ADLs/Self Care Home Management;Aquatic Therapy;Biofeedback;Cryotherapy;Electrical Stimulation;Iontophoresis 4mg /ml Dexamethasone;Moist Heat;Traction;Ultrasound;DME Instruction;Gait training;Stair training;Functional mobility training;Therapeutic activities;Therapeutic exercise;Balance training;Neuromuscular re-education;Patient/family education;Orthotic Fit/Training;Manual techniques;Manual lymph drainage;Compression bandaging;Passive range of motion;Dry needling;Energy conservation;Taping;Vasopneumatic Device;Spinal Manipulations;Joint Manipulations    PT Next Visit Plan  Continue to progress gait, balance and functional BLE strengthening as tolerated. Increase step height, add foam to balance next visit, possibly try SLS with vectors if able.    PT Home Exercise Plan  01/02/20: sit to stand, heel raises, tandem stance at counter    Consulted and Agree with Plan of Care   Patient       Patient will benefit from skilled therapeutic intervention in order to improve the following deficits and impairments:  Abnormal gait, Decreased activity tolerance, Decreased balance, Decreased endurance, Decreased knowledge of use of DME, Decreased mobility, Decreased range of motion, Decreased strength, Difficulty walking, Improper body mechanics  Visit Diagnosis: Other  abnormalities of gait and mobility  Muscle weakness (generalized)  Other symptoms and signs involving the nervous system     Problem List Patient Active Problem List   Diagnosis Date Noted  . Acute CVA (cerebrovascular accident) ([facility]) 12/18/2019  . Allergy or intolerance to drug 05/27/2013  . Postoperative anemia due to acute blood loss 05/27/2013  . Osteoarthritis of right hip 05/24/2013    2:34 PM, 01/08/20 Mearl Latin PT, DPT Physical Therapist at [facility], [place], [address] Phone: [phone]   Fax:  [phone]  Name: [name] MRN: [id number] Date of Birth: 04/11/1951

## 2020-01-08 NOTE — Telephone Encounter (Signed)
pt called to cancel her friday appts due to she will be getting the covid vaccine

## 2020-01-09 ENCOUNTER — Encounter (HOSPITAL_COMMUNITY): Payer: BC Managed Care – PPO | Admitting: Physical Therapy

## 2020-01-09 ENCOUNTER — Encounter (HOSPITAL_COMMUNITY): Payer: BC Managed Care – PPO

## 2020-01-10 ENCOUNTER — Ambulatory Visit (HOSPITAL_COMMUNITY): Payer: Medicare Other

## 2020-01-13 ENCOUNTER — Encounter (HOSPITAL_COMMUNITY): Payer: BC Managed Care – PPO | Admitting: Physical Therapy

## 2020-01-14 ENCOUNTER — Encounter (HOSPITAL_COMMUNITY): Payer: BC Managed Care – PPO | Admitting: Occupational Therapy

## 2020-01-15 ENCOUNTER — Encounter (HOSPITAL_COMMUNITY): Payer: Self-pay | Admitting: Physical Therapy

## 2020-01-15 ENCOUNTER — Other Ambulatory Visit: Payer: Self-pay

## 2020-01-15 ENCOUNTER — Encounter (HOSPITAL_COMMUNITY): Payer: Self-pay

## 2020-01-15 ENCOUNTER — Ambulatory Visit (HOSPITAL_COMMUNITY): Payer: Medicare Other

## 2020-01-15 ENCOUNTER — Ambulatory Visit (HOSPITAL_COMMUNITY): Payer: Medicare Other | Admitting: Physical Therapy

## 2020-01-15 DIAGNOSIS — M6281 Muscle weakness (generalized): Secondary | ICD-10-CM

## 2020-01-15 DIAGNOSIS — R29818 Other symptoms and signs involving the nervous system: Secondary | ICD-10-CM

## 2020-01-15 DIAGNOSIS — R2689 Other abnormalities of gait and mobility: Secondary | ICD-10-CM

## 2020-01-15 DIAGNOSIS — R29898 Other symptoms and signs involving the musculoskeletal system: Secondary | ICD-10-CM

## 2020-01-15 DIAGNOSIS — R278 Other lack of coordination: Secondary | ICD-10-CM

## 2020-01-15 NOTE — Therapy (Signed)
[place], [place], [address] Phone: [phone]   Fax:  [phone]  Occupational Therapy Treatment  Patient Details  Name: [name] MRN: [id number] Date of Birth: July 04, 1951 Referring Provider (OT): Flora Lipps MD   Encounter Date: 01/15/2020  OT End of Session - 01/15/20 1049    Visit Number  4    Number of Visits  8    Date for OT Re-Evaluation  01/29/20    Authorization Type  1) BCBS State 2) Medicare    Authorization Time Period  no visit limit. Progress note at visit 10    Authorization - Visit Number  4    Authorization - Number of Visits  10    OT Start Time  0962    OT Stop Time  1111    OT Time Calculation (min)  38 min    Activity Tolerance  Patient tolerated treatment well    Behavior During Therapy  WFL for tasks assessed/performed       Past Medical History:  Diagnosis Date  . Arthritis   . GERD (gastroesophageal reflux disease)     Past Surgical History:  Procedure Laterality Date  . NO PAST SURGERIES    . TOTAL HIP ARTHROPLASTY Right 05/22/2013   Procedure: TOTAL HIP ARTHROPLASTY- right ;  Surgeon: Ninetta Lights, MD;  Location: [facility];  Service: Orthopedics;  Laterality: Right;    There were no vitals filed for this visit.  Subjective Assessment - 01/15/20 1044    Currently in Pain?  No/denies         [facility] OT Assessment - 01/15/20 1044      Assessment   Medical Diagnosis  Acute CVA      Precautions   Precautions  None               OT Treatments/Exercises (OP) - 01/15/20 1044      Exercises   Exercises  Shoulder;Hand      Shoulder Exercises: Seated   Horizontal ABduction  Theraband;10 reps    Theraband Level (Shoulder Horizontal ABduction)  Level 2 (Red)    External Rotation  Theraband;10 reps    Theraband Level (Shoulder External Rotation)  Level 2 (Red)    Internal Rotation  Theraband;10 reps    Theraband Level (Shoulder Internal Rotation)   Level 2 (Red)    Abduction  Theraband;10 reps    Theraband Level (Shoulder ABduction)  Level 2 (Red)    Other Seated Exercises  Shoulder adduction; red band; 10X      Shoulder Exercises: Standing   Extension  Theraband;10 reps    Theraband Level (Shoulder Extension)  Level 2 (Red)    Row  Theraband;10 reps    Theraband Level (Shoulder Row)  Level 2 (Red)    Retraction  Theraband;10 reps    Theraband Level (Shoulder Retraction)  Level 2 (Red)      Shoulder Exercises: Therapy Ball   Other Therapy Ball Exercises  green therapy ball: chest press, flexion, circles 10X      Hand Exercises   Hand Gripper with Large Beads  all beads gripper at 29#    Hand Gripper with Medium Beads  2 beads with gripper set at 29#             OT Education - 01/15/20 1043    Education Details  red theraband strengthening exercises.    Person(s) Educated  Patient    Methods  Explanation;Demonstration;Handout;Verbal cues  Comprehension  Verbalized understanding;Returned demonstration       OT Short Term Goals - 01/02/20 1024      OT SHORT TERM GOAL #1   Title  Patient will be educated and independent with HEP in order to faciliate her progress in therapy and allow her to increase her use of her LUE during daily and leisure tasks.    Time  4    Period  Weeks    Status  On-going    Target Date  01/29/20      OT SHORT TERM GOAL #2   Title  Patient will increase her left hand grip strength by 10# and her pinch strength by 4# in order to increase her ability to hold onto weighted household items without dropping.    Time  4    Period  Weeks    Status  On-going      OT SHORT TERM GOAL #3   Title  Patient will increase her left hand coordination by completing the 9 hole peg test in 23" or less.    Time  4    Period  Weeks    Status  On-going      OT SHORT TERM GOAL #4   Title  Patient will increase her LUE shoulder strength to 4+/5 in order to complete normal household lifting activities with  less difficulty.    Time  4    Period  Weeks    Status  On-going               Plan - 01/15/20 1118    Clinical Impression Statement  A: Provided UE strengthening with red band. Printout was provided for HEP. Patient was given VC for form and technique. Rest breaks were needed during session due to muscle fatigue. Increased difficulty with handgripper task and was unable to complete due to time constraint.    Body Structure / Function / Physical Skills  ADL;UE functional use;FMC;Coordination;GMC;Strength;IADL    Plan  P: Follow up on HEP. Complete hand strengthening and coordination tasks first. Handgripper task with patient attempting 29# resistance for all large and medium size beads.    Consulted and Agree with Plan of Care  Patient       Patient will benefit from skilled therapeutic intervention in order to improve the following deficits and impairments:   Body Structure / Function / Physical Skills: ADL, UE functional use, FMC, Coordination, GMC, Strength, IADL       Visit Diagnosis: Other symptoms and signs involving the nervous system  Other lack of coordination  Other symptoms and signs involving the musculoskeletal system    Problem List Patient Active Problem List   Diagnosis Date Noted  . Acute CVA (cerebrovascular accident) (HCC) 12/18/2019  . Allergy or intolerance to drug 05/27/2013  . Postoperative anemia due to acute blood loss 05/27/2013  . Osteoarthritis of right hip 05/24/2013   [name], OTR/L,CBIS  [phone]  01/15/2020, 11:42 AM  [facility] [address] [place], [place], [address] Phone: [phone]   Fax:  [phone]  Name: [name] MRN: [id number] Date of Birth: [birth year]

## 2020-01-15 NOTE — Therapy (Signed)
[facility] [address] [place], [place], [address] Phone: [phone]   Fax:  [phone]  Physical Therapy Treatment  Patient Details  Name: [name] MRN: [id number] Date of Birth: May 26, 1951 Referring Provider (PT): Joycelyn Das MD   Encounter Date: 01/15/2020  PT End of Session - 01/15/20 0908    Visit Number  4    Number of Visits  12    Date for PT Re-Evaluation  02/11/20    Authorization Type  BCBS state health plan Secondary: medicare (PT/OT/ST No visit limit, no auth required)    Authorization Time Period  01/03/20-02/11/20    Authorization - Visit Number  4    Authorization - Number of Visits  10    PT Start Time  0904    PT Stop Time  0942    PT Time Calculation (min)  38 min    Equipment Utilized During Treatment  Gait belt    Activity Tolerance  Patient tolerated treatment well    Behavior During Therapy  [facility] for tasks assessed/performed       Past Medical History:  Diagnosis Date  . Arthritis   . GERD (gastroesophageal reflux disease)     Past Surgical History:  Procedure Laterality Date  . NO PAST SURGERIES    . TOTAL HIP ARTHROPLASTY Right 05/22/2013   Procedure: TOTAL HIP ARTHROPLASTY- right ;  Surgeon: Loreta Ave, MD;  Location: [facility] OR;  Service: Orthopedics;  Laterality: Right;    There were no vitals filed for this visit.  Subjective Assessment - 01/15/20 0906    Subjective  Patient reports no new issues since last visit. Says exercises are going well. Reports no pain currently.    Limitations  Walking;House hold activities    How long can you walk comfortably?  15 minutes    Patient Stated Goals  "come back better than she was before".    Currently in Pain?  No/denies                       [facility] Adult PT Treatment/Exercise - 01/15/20 0001      Knee/Hip Exercises: Standing   Heel Raises  Both;20 reps    Hip Abduction  Both;20 reps    Hip Extension  Both;20 reps    Lateral Step Up  Both;1 set;15 reps;Hand Hold: 1;Step Height: 6"    Forward Step Up  Both;1 set;15 reps;Hand Hold: 1;Step Height: 6"    SLS  3 x10" solid floor, intermittent HHA    Gait Training  226 feet with no AD    Other Standing Knee Exercises  tandem stance, foam, 3 x 30" ea    Other Standing Knee Exercises  sidestepping, 15', 2RT; tandem gait, 2RT 15', retro walking 15' 2RT      Knee/Hip Exercises: Seated   Sit to Sand  2 sets;10 reps;without UE support               PT Short Term Goals - 01/08/20 1415      PT SHORT TERM GOAL #1   Title  Patient will be independent with HEP in order to improve functional outcomes.    Time  3    Period  Weeks    Status  On-going    Target Date  01/21/20        PT Long Term Goals - 01/08/20 1415      PT LONG TERM GOAL #1   Title  Patient will improve FOTO by at least 10% in order to demonstrate improved tolerance to activity.    Time  6    Period  Weeks    Status  On-going      PT LONG TERM GOAL #2   Title  Patient will report at least 50% improvement in symptoms in order to improve quality of life.    Time  6    Period  Weeks    Status  On-going      PT LONG TERM GOAL #3   Title  Patient will be able to ambulate at least 400 feet in in order to improve gait speed for ambulating in the community.    Time  6    Period  Weeks    Status  On-going      PT LONG TERM GOAL #4   Title  Patient will improve 5xSTS time to <11.4 seconds in order to reduce the risk of falls.    Time  6    Period  Weeks    Status  On-going            Plan - 01/15/20 0941    Clinical Impression Statement  Patient is making good progress to therapy goals. Patient tolerated session well today with no complaint. Patient did demo mild fatigue with static balance activity, become more challenged with SLS after added tandem stance on foam due to muscle fatigue. Added retro walking for improving dynamic balance. Patient ambulated in slowed,  small step cadence, cued for approximation to blue line, and with CG for safety. Overall patient displays minimal fatigue post session. Will continue top progress as tolerated.    Personal Factors and Comorbidities  Fitness;Comorbidity 1    Comorbidities  hypertension    Examination-Activity Limitations  Locomotion Level;Carry;Squat;Stairs;Transfers    Examination-Participation Restrictions  Cleaning;Volunteer;Shop;Yard Work    Stability/Clinical Decision Making  Stable/Uncomplicated    Rehab Potential  Good    PT Frequency  2x / week    PT Duration  6 weeks    PT Treatment/Interventions  ADLs/Self Care Home Management;Aquatic Therapy;Biofeedback;Cryotherapy;Electrical Stimulation;Iontophoresis 4mg /ml Dexamethasone;Moist Heat;Traction;Ultrasound;DME Instruction;Gait training;Stair training;Functional mobility training;Therapeutic activities;Therapeutic exercise;Balance training;Neuromuscular re-education;Patient/family education;Orthotic Fit/Training;Manual techniques;Manual lymph drainage;Compression bandaging;Passive range of motion;Dry needling;Energy conservation;Taping;Vasopneumatic Device;Spinal Manipulations;Joint Manipulations    PT Next Visit Plan  Continue to progress gait, balance and functional BLE strengthening as tolerated. Add band to hip abd and extension. Add SLS with vectors    PT Home Exercise Plan  01/02/20: sit to stand, heel raises, tandem stance at counter    Consulted and Agree with Plan of Care  Patient       Patient will benefit from skilled therapeutic intervention in order to improve the following deficits and impairments:  Abnormal gait, Decreased activity tolerance, Decreased balance, Decreased endurance, Decreased knowledge of use of DME, Decreased mobility, Decreased range of motion, Decreased strength, Difficulty walking, Improper body mechanics  Visit Diagnosis: Other abnormalities of gait and mobility  Muscle weakness (generalized)  Other symptoms and signs  involving the nervous system     Problem List Patient Active Problem List   Diagnosis Date Noted  . Acute CVA (cerebrovascular accident) (HCC) 12/18/2019  . Allergy or intolerance to drug 05/27/2013  . Postoperative anemia due to acute blood loss 05/27/2013  . Osteoarthritis of right hip 05/24/2013   9:46 AM, 01/15/20 03/14/20 PT DPT  Physical Therapist with [place]  [facility]  [phone]   [facility]  [place], [place], [address] Phone: [phone]   Fax:  [phone]  Name: TEMIKA SUTPHIN MRN: [id number] Date of Birth: 03/15/1951

## 2020-01-15 NOTE — Patient Instructions (Signed)
Strengthening: Chest Pull - Resisted   Hold Theraband in front of body with hands about shoulder width a part. Pull band a part and back together slowly. Repeat _10-15___ times. Complete _1___ set(s) per session.. Repeat _1-2___ session(s) per day.  http://orth.exer.us/926   Copyright  VHI. All rights reserved.   PNF Strengthening: Resisted   Standing with resistive band around each hand, bring right arm up and away, thumb back. Repeat __10-15__ times per set. Do __`__ sets per session. Do _`-2___ sessions per day.     Resisted External Rotation: in Neutral - Bilateral   Sit or stand, tubing in both hands, elbows at sides, bent to 90, forearms forward. Pinch shoulder blades together and rotate forearms out. Keep elbows at sides. Repeat _10-15___ times per set. Do __1__ sets per session. Do _1-2___ sessions per day.  http://orth.exer.us/966   Copyright  VHI. All rights reserved.   PNF Strengthening: Resisted   Standing, hold resistive band above head. Bring right arm down and out from side. Repeat _10-15___ times per set. Do __1__ sets per session. Do _1-2___ sessions per day.  http://orth.exer.us/922   Copyright  VHI. All rights reserved.

## 2020-01-16 ENCOUNTER — Encounter (HOSPITAL_COMMUNITY): Payer: BC Managed Care – PPO | Admitting: Physical Therapy

## 2020-01-16 ENCOUNTER — Other Ambulatory Visit: Payer: Self-pay

## 2020-01-16 ENCOUNTER — Ambulatory Visit: Payer: Self-pay

## 2020-01-16 ENCOUNTER — Encounter (HOSPITAL_COMMUNITY): Payer: BC Managed Care – PPO

## 2020-01-16 NOTE — Patient Outreach (Addendum)
[facility]) Care Management  01/16/2020  [name] [birth year] [id number]   Attempted to contact patient today to ensure receipt of Advance Directive packet/Emmi and assist with completion if needed.  Left voicemail messages on home and mobile numbers.  Will attempt to reach again within four business days.    Addendum:  Received return call from patient, however, she did not receive Advance Directive Emmi and packet mailed on 01/02/20.  Will send again today. Patient being transferred to CSW, Danford Bad for follow up.   Malachy Chamber, BSW Social Worker [phone]

## 2020-01-17 ENCOUNTER — Other Ambulatory Visit: Payer: Self-pay

## 2020-01-17 ENCOUNTER — Ambulatory Visit (HOSPITAL_COMMUNITY): Payer: Medicare Other

## 2020-01-17 ENCOUNTER — Encounter (HOSPITAL_COMMUNITY): Payer: Self-pay

## 2020-01-17 DIAGNOSIS — R2689 Other abnormalities of gait and mobility: Secondary | ICD-10-CM | POA: Diagnosis not present

## 2020-01-17 DIAGNOSIS — M6281 Muscle weakness (generalized): Secondary | ICD-10-CM

## 2020-01-17 DIAGNOSIS — R29898 Other symptoms and signs involving the musculoskeletal system: Secondary | ICD-10-CM

## 2020-01-17 DIAGNOSIS — R29818 Other symptoms and signs involving the nervous system: Secondary | ICD-10-CM

## 2020-01-17 DIAGNOSIS — R278 Other lack of coordination: Secondary | ICD-10-CM

## 2020-01-17 NOTE — Therapy (Signed)
[facility] [address] [place], [place], [address] Phone: [phone]   Fax:  [phone]  Physical Therapy Treatment  Patient Details  Name: [name] MRN: [id number] Date of Birth: [birth year] Referring Provider (PT): Joycelyn Das MD   Encounter Date: 01/17/2020  PT End of Session - 01/17/20 1008    Visit Number  5    Number of Visits  12    Date for PT Re-Evaluation  02/11/20    Authorization Type  BCBS state health plan Secondary: medicare (PT/OT/ST No visit limit, no auth required)    Authorization Time Period  01/03/20-02/11/20    Authorization - Visit Number  5    Authorization - Number of Visits  10    PT Start Time  1005    PT Stop Time  1043    PT Time Calculation (min)  38 min    Equipment Utilized During Treatment  Gait belt    Activity Tolerance  Patient tolerated treatment well    Behavior During Therapy  WFL for tasks assessed/performed       Past Medical History:  Diagnosis Date  . Arthritis   . GERD (gastroesophageal reflux disease)     Past Surgical History:  Procedure Laterality Date  . NO PAST SURGERIES    . TOTAL HIP ARTHROPLASTY Right 05/22/2013   Procedure: TOTAL HIP ARTHROPLASTY- right ;  Surgeon: Loreta Ave, MD;  Location: [facility] OR;  Service: Orthopedics;  Laterality: Right;    There were no vitals filed for this visit.  Subjective Assessment - 01/17/20 1008    Subjective  Pt stated she had some shoulder pain this morning, OT helped with pain.  Reports compliance wiht HEP daily.    Patient Stated Goals  "come back better than she was before".    Currently in Pain?  No/denies         [facility] PT Assessment - 01/17/20 1016      Assessment   Medical Diagnosis  Acute CVA    Referring Provider (PT)  Joycelyn Das MD    Onset Date/Surgical Date  12/16/19    Hand Dominance  Right    Next MD Visit  01/21/20; PCP beginning of march    Prior Therapy  Acute OT only      Precautions   Precautions  None                   [facility] Adult PT Treatment/Exercise - 01/17/20 1014      Knee/Hip Exercises: Standing   Heel Raises  Both;20 reps    Heel Raises Limitations  toe raises slant    Hip Abduction  Both;15 reps;Knee straight    Abduction Limitations  RTB around thigh    Hip Extension  Both;15 reps;Knee straight    Extension Limitations  RTB around thigh    Stairs  3RT reciprocal pattern 7in 1-2 HR    SLS  Rt 10", Lt 7" max of 5    SLS with Vectors  3x5" with intermittent HHA    Other Standing Knee Exercises  tandem stance foam 1x30"; head turns in tandem stance on foam 5x each    Other Standing Knee Exercises  sidestep 2RT RTB; tandem and retro gait x 2      Knee/Hip Exercises: Seated   Sit to Sand  10 reps;without UE support   eccentric control              PT Education - 01/17/20  82    Education Details  Pt informed of brain injury and stroke support group for community support    Person(s) Educated  Patient    Methods  Explanation;Handout    Comprehension  Verbalized understanding       PT Short Term Goals - 01/08/20 1415      PT SHORT TERM GOAL #1   Title  Patient will be independent with HEP in order to improve functional outcomes.    Time  3    Period  Weeks    Status  On-going    Target Date  01/21/20        PT Long Term Goals - 01/08/20 1415      PT LONG TERM GOAL #1   Title  Patient will improve FOTO by at least 10% in order to demonstrate improved tolerance to activity.    Time  6    Period  Weeks    Status  On-going      PT LONG TERM GOAL #2   Title  Patient will report at least 50% improvement in symptoms in order to improve quality of life.    Time  6    Period  Weeks    Status  On-going      PT LONG TERM GOAL #3   Title  Patient will be able to ambulate at least 400 feet in 2MWT in order to improve gait speed for ambulating in the community.    Time  6    Period  Weeks    Status  On-going      PT LONG TERM  GOAL #4   Title  Patient will improve 5xSTS time to <11.4 seconds in order to reduce the risk of falls.    Time  6    Period  Weeks    Status  On-going            Plan - 01/17/20 1413    Clinical Impression Statement  Pt progressing well with therapy.  Pt demonstrates improved static balance noted during tandem stance today with no HHA or LOB episodes.  Increased difficulty with headturns requiring min A.  Progressed hip strengthening with additional theraband resistance as well as vector stance with intermittent HHA required for LOB.  EOS pt was educated on support group for community support, flyer given.  Pt with mild fatigue at EOS.    Personal Factors and Comorbidities  Fitness;Comorbidity 1    Comorbidities  hypertension    Examination-Activity Limitations  Locomotion Level;Carry;Squat;Stairs;Transfers    Examination-Participation Restrictions  Cleaning;Volunteer;Shop;Yard Work    Stability/Clinical Decision Making  Stable/Uncomplicated    Clinical Decision Making  Low    Rehab Potential  Good    PT Frequency  2x / week    PT Duration  6 weeks    PT Treatment/Interventions  ADLs/Self Care Home Management;Aquatic Therapy;Biofeedback;Cryotherapy;Electrical Stimulation;Iontophoresis 4mg /ml Dexamethasone;Moist Heat;Traction;Ultrasound;DME Instruction;Gait training;Stair training;Functional mobility training;Therapeutic activities;Therapeutic exercise;Balance training;Neuromuscular re-education;Patient/family education;Orthotic Fit/Training;Manual techniques;Manual lymph drainage;Compression bandaging;Passive range of motion;Dry needling;Energy conservation;Taping;Vasopneumatic Device;Spinal Manipulations;Joint Manipulations    PT Next Visit Plan  Continue to progress gait, balance and functional BLE strengthening as tolerated.  Add squats next session.    PT Home Exercise Plan  01/02/20: sit to stand, heel raises, tandem stance at counter       Patient will benefit from skilled  therapeutic intervention in order to improve the following deficits and impairments:  Abnormal gait, Decreased activity tolerance, Decreased balance, Decreased endurance, Decreased knowledge of use of DME, Decreased  mobility, Decreased range of motion, Decreased strength, Difficulty walking, Improper body mechanics  Visit Diagnosis: Other symptoms and signs involving the nervous system  Other abnormalities of gait and mobility  Muscle weakness (generalized)     Problem List Patient Active Problem List   Diagnosis Date Noted  . Acute CVA (cerebrovascular accident) (HCC) 12/18/2019  . Allergy or intolerance to drug 05/27/2013  . Postoperative anemia due to acute blood loss 05/27/2013  . Osteoarthritis of right hip 05/24/2013   Becky Sax, LPTA; CBIS [phone]  Juel Burrow 01/17/2020, 2:22 PM  [facility] [address] [place], [place], [address] Phone: [phone]   Fax:  [phone]  Name: [name] MRN: [id number] Date of Birth: [birth year]

## 2020-01-17 NOTE — Therapy (Signed)
[facility] [address] [place], [place], [address] Phone: [phone]   Fax:  [phone]  Occupational Therapy Treatment  Patient Details  Name: [name] MRN: [id number] Date of Birth: 1951/04/11 Referring Provider (OT): Joycelyn Das MD   Encounter Date: 01/17/2020  OT End of Session - 01/17/20 0943    Visit Number  5    Number of Visits  8    Date for OT Re-Evaluation  01/29/20    Authorization Type  1) BCBS State 2) Medicare    Authorization Time Period  no visit limit. Progress note at visit 10    Authorization - Visit Number  5    Authorization - Number of Visits  10    OT Start Time  0900    OT Stop Time  [phone]    OT Time Calculation (min)  38 min    Activity Tolerance  Patient tolerated treatment well    Behavior During Therapy  WFL for tasks assessed/performed       Past Medical History:  Diagnosis Date  . Arthritis   . GERD (gastroesophageal reflux disease)     Past Surgical History:  Procedure Laterality Date  . NO PAST SURGERIES    . TOTAL HIP ARTHROPLASTY Right 05/22/2013   Procedure: TOTAL HIP ARTHROPLASTY- right ;  Surgeon: Loreta Ave, MD;  Location: [facility] OR;  Service: Orthopedics;  Laterality: Right;    There were no vitals filed for this visit.  Subjective Assessment - 01/17/20 0904    Subjective   S: I think I did the exercise wrong because something started to hurt in my shoulder during this movement (demonstrating abduction). I stopped after I felt pain. It feels better now than it did yesterday.    Currently in Pain?  Yes    Pain Score  3     Pain Location  Shoulder    Pain Orientation  Left    Pain Descriptors / Indicators  Sore    Pain Type  Acute pain    Pain Radiating Towards  N/A    Pain Onset  Yesterday    Pain Frequency  Constant    Aggravating Factors   New theraband exercise (abduction)    Pain Relieving Factors  Tylenol    Effect of Pain on Daily Activities  No effect    Multiple Pain Sites  No         [facility] OT Assessment - 01/17/20 0906      Assessment   Medical Diagnosis  Acute CVA      Precautions   Precautions  None               OT Treatments/Exercises (OP) - 01/17/20 0906      Exercises   Exercises  Hand      Shoulder Exercises: Seated   Horizontal ABduction  Theraband;10 reps    Theraband Level (Shoulder Horizontal ABduction)  Level 2 (Red)    External Rotation  Theraband;10 reps    Theraband Level (Shoulder External Rotation)  Level 2 (Red)    Internal Rotation  Theraband;10 reps    Theraband Level (Shoulder Internal Rotation)  Level 2 (Red)    Abduction  Theraband;10 reps    Theraband Level (Shoulder ABduction)  Level 2 (Red)    Other Seated Exercises  Shoulder adduction; red band; 10X      Hand Exercises   Hand Gripper with Large Beads  all beads gripper at 29#  Hand Gripper with Medium Beads  all beads with gripper at 29#    Hand Gripper with Small Beads  all beads with gripper set at 25#    Sponges  low resistance: 14 high resistance: 12    Other Hand Exercises  utilized green resistive clothespins and 3 point pinch to pick up and transfer 30 sponges from table to container.       Fine Motor Coordination (Hand/Wrist)   Fine Motor Coordination  Picking up coins;Manipulating coins;Stacking coins    Picking up coins  Fingertip (index and thumb transfer into palm while picking up 5 one at a time.     Manipulating coins  transferred coins from palm to finger tip one at a time to stack    Stacking coins  Able to stack 5 coins into a tower with min difficulty. Occassional dropping/sliding out of hand.              OT Education - 01/17/20 [phone]    Education Details  Reviewed HEP scapular theraband strengthening exercises to ensure proper form and technique due to recent experience of pain during abduction exercise.    Person(s) Educated  Patient    Methods  Explanation;Demonstration;Verbal cues    Comprehension   Verbalized understanding;Returned demonstration       OT Short Term Goals - 01/02/20 1024      OT SHORT TERM GOAL #1   Title  Patient will be educated and independent with HEP in order to faciliate her progress in therapy and allow her to increase her use of her LUE during daily and leisure tasks.    Time  4    Period  Weeks    Status  On-going    Target Date  01/29/20      OT SHORT TERM GOAL #2   Title  Patient will increase her left hand grip strength by 10# and her pinch strength by 4# in order to increase her ability to hold onto weighted household items without dropping.    Time  4    Period  Weeks    Status  On-going      OT SHORT TERM GOAL #3   Title  Patient will increase her left hand coordination by completing the 9 hole peg test in 23" or less.    Time  4    Period  Weeks    Status  On-going      OT SHORT TERM GOAL #4   Title  Patient will increase her LUE shoulder strength to 4+/5 in order to complete normal household lifting activities with less difficulty.    Time  4    Period  Weeks    Status  On-going               Plan - 01/17/20 9937    Clinical Impression Statement  A: Focused on coordination and hand strength for majority of session. Hand fatigue noted during strengthening although able to continue with rest breaks. Patient was able to increase the resistance of the handgripper to pick up all size beads. Reviewed HEP and made recommendations for adjustments to ensure proper form and technique.    Body Structure / Function / Physical Skills  ADL;UE functional use;FMC;Coordination;GMC;Strength;IADL    Plan  P: Follow up on HEP. Continue with shoulder strengthening.    Consulted and Agree with Plan of Care  Patient       Patient will benefit from skilled therapeutic intervention in order to improve the  following deficits and impairments:   Body Structure / Function / Physical Skills: ADL, UE functional use, FMC, Coordination, GMC, Strength, IADL        Visit Diagnosis: Other symptoms and signs involving the nervous system  Other lack of coordination  Other symptoms and signs involving the musculoskeletal system    Problem List Patient Active Problem List   Diagnosis Date Noted  . Acute CVA (cerebrovascular accident) ([place]) 12/18/2019  . Allergy or intolerance to drug 05/27/2013  . Postoperative anemia due to acute blood loss 05/27/2013  . Osteoarthritis of right hip 05/24/2013   Ailene Ravel, OTR/L,CBIS  [phone]  01/17/2020, 9:45 AM  [place], [place], [address] Phone: [phone]   Fax:  [phone]  Name: EVONY REZEK MRN: [id number] Date of Birth: 05-31-1951

## 2020-01-20 ENCOUNTER — Encounter (HOSPITAL_COMMUNITY): Payer: Self-pay | Admitting: Physical Therapy

## 2020-01-20 ENCOUNTER — Encounter (HOSPITAL_COMMUNITY): Payer: Self-pay

## 2020-01-20 ENCOUNTER — Ambulatory Visit (HOSPITAL_COMMUNITY): Payer: Medicare Other | Admitting: Physical Therapy

## 2020-01-20 ENCOUNTER — Other Ambulatory Visit: Payer: Self-pay

## 2020-01-20 ENCOUNTER — Encounter (INDEPENDENT_AMBULATORY_CARE_PROVIDER_SITE_OTHER): Payer: Self-pay

## 2020-01-20 ENCOUNTER — Ambulatory Visit (HOSPITAL_COMMUNITY): Payer: Medicare Other

## 2020-01-20 DIAGNOSIS — R278 Other lack of coordination: Secondary | ICD-10-CM

## 2020-01-20 DIAGNOSIS — M6281 Muscle weakness (generalized): Secondary | ICD-10-CM

## 2020-01-20 DIAGNOSIS — R2689 Other abnormalities of gait and mobility: Secondary | ICD-10-CM | POA: Diagnosis not present

## 2020-01-20 DIAGNOSIS — R29818 Other symptoms and signs involving the nervous system: Secondary | ICD-10-CM

## 2020-01-20 DIAGNOSIS — R29898 Other symptoms and signs involving the musculoskeletal system: Secondary | ICD-10-CM

## 2020-01-20 NOTE — Therapy (Signed)
[place], [place], [address] Phone: [phone]   Fax:  [phone]  Occupational Therapy Treatment  Patient Details  Name: [name] MRN: [id number] Date of Birth: June 22, 1951 Referring Provider (OT): Flora Lipps MD   Encounter Date: 01/20/2020  OT End of Session - 01/20/20 1021    Visit Number  6    Number of Visits  8    Date for OT Re-Evaluation  01/29/20    Authorization Type  1) BCBS State 2) Medicare    Authorization Time Period  no visit limit. Progress note at visit 10    Authorization - Visit Number  6    Authorization - Number of Visits  10    OT Start Time  0945    OT Stop Time  1023    OT Time Calculation (min)  38 min    Activity Tolerance  Patient tolerated treatment well    Behavior During Therapy  WFL for tasks assessed/performed       Past Medical History:  Diagnosis Date  . Arthritis   . GERD (gastroesophageal reflux disease)     Past Surgical History:  Procedure Laterality Date  . NO PAST SURGERIES    . TOTAL HIP ARTHROPLASTY Right 05/22/2013   Procedure: TOTAL HIP ARTHROPLASTY- right ;  Surgeon: Ninetta Lights, MD;  Location: [facility];  Service: Orthopedics;  Laterality: Right;    There were no vitals filed for this visit.  Subjective Assessment - 01/20/20 1045    Subjective   S: I haven't had any arm pain since that once time.    Currently in Pain?  No/denies         [facility] OT Assessment - 01/20/20 0947      Assessment   Medical Diagnosis  Acute CVA      Precautions   Precautions  None               OT Treatments/Exercises (OP) - 01/20/20 0947      Exercises   Exercises  Shoulder;Elbow;Theraputty      Shoulder Exercises: Seated   Protraction  Strengthening;10 reps   alternating   Protraction Weight (lbs)  2    Horizontal ABduction  Strengthening;10 reps    Horizontal ABduction Weight (lbs)  2    External Rotation  Strengthening;10 reps     External Rotation Weight (lbs)  2    Internal Rotation  Strengthening;10 reps    Internal Rotation Weight (lbs)  2    Flexion  Strengthening;10 reps    Flexion Weight (lbs)  2    Abduction  Strengthening;10 reps    ABduction Weight (lbs)  1    Other Seated Exercises  Overhead punch; 10X; A/ROM      Shoulder Exercises: ROM/Strengthening   UBE (Upper Arm Bike)  Level 1 2' forward 2' reverse   pace: 5.0-6.0   Over Head Lace  seated 2'      Elbow Exercises   Elbow Extension  Seated;10 reps    Bar Weights/Barbell (Elbow Extension)  3 lbs   slight bend forward   Other elbow exercises  Bicep flexion: hammer curl 10X, supinated 10X, pronated 10X with 3# seated      Additional Elbow Exercises   Theraputty - Flatten  red- standing      Hand Exercises   Other Hand Exercises  PVC pipe used to press circles into putty while focusing on UB strength and grip strength.  OT Short Term Goals - 01/02/20 1024      OT SHORT TERM GOAL #1   Title  Patient will be educated and independent with HEP in order to faciliate her progress in therapy and allow her to increase her use of her LUE during daily and leisure tasks.    Time  4    Period  Weeks    Status  On-going    Target Date  01/29/20      OT SHORT TERM GOAL #2   Title  Patient will increase her left hand grip strength by 10# and her pinch strength by 4# in order to increase her ability to hold onto weighted household items without dropping.    Time  4    Period  Weeks    Status  On-going      OT SHORT TERM GOAL #3   Title  Patient will increase her left hand coordination by completing the 9 hole peg test in 23" or less.    Time  4    Period  Weeks    Status  On-going      OT SHORT TERM GOAL #4   Title  Patient will increase her LUE shoulder strength to 4+/5 in order to complete normal household lifting activities with less difficulty.    Time  4    Period  Weeks    Status  On-going                Plan - 01/20/20 1042    Clinical Impression Statement  A: Focused on shoulder strengthening mainly with use of hand weights. Exercise modifications were made for proper form and technique. VC for form and technique were provided. Pt continues to demonstrate mild muscle fatigue during exercises and is able to take rest breaks as needed.    Body Structure / Function / Physical Skills  ADL;UE functional use;FMC;Coordination;GMC;Strength;IADL    Plan  P: Red theraputty activities for hand strengthening.    Consulted and Agree with Plan of Care  Patient       Patient will benefit from skilled therapeutic intervention in order to improve the following deficits and impairments:   Body Structure / Function / Physical Skills: ADL, UE functional use, FMC, Coordination, GMC, Strength, IADL       Visit Diagnosis: Other lack of coordination  Other symptoms and signs involving the musculoskeletal system    Problem List Patient Active Problem List   Diagnosis Date Noted  . Acute CVA (cerebrovascular accident) (HCC) 12/18/2019  . Allergy or intolerance to drug 05/27/2013  . Postoperative anemia due to acute blood loss 05/27/2013  . Osteoarthritis of right hip 05/24/2013   [name], OTR/L,CBIS  [phone]  01/20/2020, 10:45 AM  [facility] [address] [place], [place], [address] Phone: [phone]   Fax:  [phone]  Name: [name] MRN: [id number] Date of Birth: 10/22/1951

## 2020-01-20 NOTE — Therapy (Signed)
[facility] [address] [place], [place], [address] Phone: [phone]   Fax:  [phone]  Physical Therapy Treatment  Patient Details  Name: [name] MRN: [id number] Date of Birth: 11-22-1951 Referring Provider (PT): Joycelyn Das MD   Encounter Date: 01/20/2020  PT End of Session - 01/20/20 0904    Visit Number  6    Number of Visits  12    Date for PT Re-Evaluation  02/11/20    Authorization Type  BCBS state health plan Secondary: medicare (PT/OT/ST No visit limit, no auth required)    Authorization Time Period  01/03/20-02/11/20    Authorization - Visit Number  6    Authorization - Number of Visits  10    PT Start Time  0900    PT Stop Time  0940    PT Time Calculation (min)  40 min    Equipment Utilized During Treatment  Gait belt    Activity Tolerance  Patient tolerated treatment well;Patient limited by fatigue    Behavior During Therapy  [facility] for tasks assessed/performed       Past Medical History:  Diagnosis Date  . Arthritis   . GERD (gastroesophageal reflux disease)     Past Surgical History:  Procedure Laterality Date  . NO PAST SURGERIES    . TOTAL HIP ARTHROPLASTY Right 05/22/2013   Procedure: TOTAL HIP ARTHROPLASTY- right ;  Surgeon: Loreta Ave, MD;  Location: [facility] OR;  Service: Orthopedics;  Laterality: Right;    There were no vitals filed for this visit.  Subjective Assessment - 01/20/20 0905    Subjective  Pateint says she is doing well today, reports no new issues since last visit. Reports no pain currently.    Patient Stated Goals  "come back better than she was before".    Currently in Pain?  No/denies                       [facility] Adult PT Treatment/Exercise - 01/20/20 0001      Knee/Hip Exercises: Stretches   Gastroc Stretch  Both;3 reps;30 seconds    Gastroc Stretch Limitations  slant board       Knee/Hip Exercises: Standing   Heel Raises  Both;20 reps    Heel Raises  Limitations  toe raises slant    Hip Abduction  Both;2 sets;10 reps;Knee straight    Abduction Limitations  RTB    Hip Extension  Both;2 sets;10 reps;Knee straight    Extension Limitations  RTB    Stairs  3RT reciprocal pattern 7in 1 HR    SLS with Vectors  3x5" each side with intermittent HHA    Other Standing Knee Exercises  tandem stance foam 2x30"; head turns in staggered stance, solid floor, 5x each    Other Standing Knee Exercises  sidestep 2RT RTB; tandem and retro gait x 2               PT Short Term Goals - 01/08/20 1415      PT SHORT TERM GOAL #1   Title  Patient will be independent with HEP in order to improve functional outcomes.    Time  3    Period  Weeks    Status  On-going    Target Date  01/21/20        PT Long Term Goals - 01/08/20 1415      PT LONG TERM GOAL #1   Title  Patient will  improve FOTO by at least 10% in order to demonstrate improved tolerance to activity.    Time  6    Period  Weeks    Status  On-going      PT LONG TERM GOAL #2   Title  Patient will report at least 50% improvement in symptoms in order to improve quality of life.    Time  6    Period  Weeks    Status  On-going      PT LONG TERM GOAL #3   Title  Patient will be able to ambulate at least 400 feet in in order to improve gait speed for ambulating in the community.    Time  6    Period  Weeks    Status  On-going      PT LONG TERM GOAL #4   Title  Patient will improve 5xSTS time to <11.4 seconds in order to reduce the risk of falls.    Time  6    Period  Weeks    Status  On-going            Plan - 01/20/20 5537    Clinical Impression Statement  Patient was able to increase reps with standing hip ab/ Ext . Patient noted pulling/ tightness in LT knee with tandem stance balance. Patient has difficulty with tandem stance head turns on foam, partially due to knee discomfort. Activity was modified to staggered stance on solid floor. Patient was able to perform  with improved balance and no knee discomfort. Patient showed increased muscle fatigue toward end of session, and displayed slight difficulty with controlled lowering on stairs, but was able to perform with reciprocal gait, using single hand rail. Patient cued for slow, controlled lowering using LLE. Overall patient progressing well to therapy goals, still limited by mild weakness and decreased tolerance to functional activity.    Personal Factors and Comorbidities  Fitness;Comorbidity 1    Comorbidities  hypertension    Examination-Activity Limitations  Locomotion Level;Carry;Squat;Stairs;Transfers    Examination-Participation Restrictions  Cleaning;Volunteer;Shop;Yard Work    Stability/Clinical Decision Making  Stable/Uncomplicated    Rehab Potential  Good    PT Frequency  2x / week    PT Duration  6 weeks    PT Treatment/Interventions  ADLs/Self Care Home Management;Aquatic Therapy;Biofeedback;Cryotherapy;Electrical Stimulation;Iontophoresis 4mg /ml Dexamethasone;Moist Heat;Traction;Ultrasound;DME Instruction;Gait training;Stair training;Functional mobility training;Therapeutic activities;Therapeutic exercise;Balance training;Neuromuscular re-education;Patient/family education;Orthotic Fit/Training;Manual techniques;Manual lymph drainage;Compression bandaging;Passive range of motion;Dry needling;Energy conservation;Taping;Vasopneumatic Device;Spinal Manipulations;Joint Manipulations    PT Next Visit Plan  Continue to progress gait, balance and functional BLE strengthening as tolerated.    PT Home Exercise Plan  01/02/20: sit to stand, heel raises, tandem stance at counter    Consulted and Agree with Plan of Care  Patient       Patient will benefit from skilled therapeutic intervention in order to improve the following deficits and impairments:  Abnormal gait, Decreased activity tolerance, Decreased balance, Decreased endurance, Decreased knowledge of use of DME, Decreased mobility, Decreased range of  motion, Decreased strength, Difficulty walking, Improper body mechanics  Visit Diagnosis: Other symptoms and signs involving the nervous system  Other abnormalities of gait and mobility  Muscle weakness (generalized)     Problem List Patient Active Problem List   Diagnosis Date Noted  . Acute CVA (cerebrovascular accident) (HCC) 12/18/2019  . Allergy or intolerance to drug 05/27/2013  . Postoperative anemia due to acute blood loss 05/27/2013  . Osteoarthritis of right hip 05/24/2013   9:44 AM, 01/20/20 01/22/20  Harlon Ditty PT DPT  Physical Therapist with [facility]  [phone] [place] [address] [place], [place], [address] Phone: [phone]   Fax:  [phone]  Name: AASHA DINA MRN: [id number] Date of Birth: [birth year]

## 2020-01-21 ENCOUNTER — Encounter: Payer: Self-pay | Admitting: Adult Health

## 2020-01-21 ENCOUNTER — Ambulatory Visit (INDEPENDENT_AMBULATORY_CARE_PROVIDER_SITE_OTHER): Payer: Medicare Other | Admitting: Adult Health

## 2020-01-21 VITALS — BP 130/80 | HR 57 | Temp 96.8°F | Ht 66.0 in | Wt 230.8 lb

## 2020-01-21 DIAGNOSIS — I639 Cerebral infarction, unspecified: Secondary | ICD-10-CM

## 2020-01-21 DIAGNOSIS — I69398 Other sequelae of cerebral infarction: Secondary | ICD-10-CM

## 2020-01-21 DIAGNOSIS — I10 Essential (primary) hypertension: Secondary | ICD-10-CM

## 2020-01-21 DIAGNOSIS — R269 Unspecified abnormalities of gait and mobility: Secondary | ICD-10-CM

## 2020-01-21 DIAGNOSIS — E785 Hyperlipidemia, unspecified: Secondary | ICD-10-CM

## 2020-01-21 NOTE — Patient Instructions (Signed)
Continue aspirin 81 mg daily  and atorvastatin  for secondary stroke prevention  Continue to follow up with PCP regarding cholesterol and blood pressure management   Continue to monitor blood pressure at home  Maintain strict control of hypertension with blood pressure goal below 130/90, diabetes with hemoglobin A1c goal below 6.5% and cholesterol with LDL cholesterol (bad cholesterol) goal below 70 mg/dL. I also advised the patient to eat a healthy diet with plenty of whole grains, cereals, fruits and vegetables, exercise regularly and maintain ideal body weight.  Followup in the future with me in 4 months or call earlier if needed       Thank you for coming to see us at [facility]. I hope we have been able to provide you high quality care today.  You may receive a patient satisfaction survey over the next few weeks. We would appreciate your feedback and comments so that we may continue to improve ourselves and the health of our patients.  

## 2020-01-21 NOTE — Progress Notes (Signed)
[facility] [address] [place]. [place] [address] [phone]       HOSPITAL FOLLOW UP NOTE  [name] Date of Birth:  1951-03-05 Medical Record Number:  [id number]   Reason for Referral:  hospital stroke follow up    CHIEF COMPLAINT:  Chief Complaint  Patient presents with  . Hospitalization Follow-up    Alone. Rm 9. No new concerns at this time.     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  History obtained from patient and chart review. Reviewed all radiology images and labs personally.  [name] is a 69 y.o. female with history of GERD, arthritis  presented on 12/18/2019 with 3 day hx L sided weakness.  Evaluated by stroke team and Dr. Roda Shutters with stroke work-up revealing right CR/SO infarct as evidenced on MRI secondary to small vessel disease.  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  2D echo unremarkable.  Recommended DAPT for 3 weeks and aspirin alone.  Hypertensive urgency upon admission stabilized and recommended long-term BP goal normotensive range.  LDL 118 initiated atorvastatin 40 mg daily.  No history of DM with A1c 6.1.  Other stroke risk factors include his age, social EtOH use and obesity.  Other active problems include arthritis and GERD.  Residual deficits mild left hemiparesis and discharged home with recommendation of outpatient PT/OT.  [name] is a 69 year old female who is being seen today for hospital follow-up.  Residual deficits of left hemiparesis with ongoing improvement and participation in outpatient PT/OT.  Completed 3 weeks of DAPT and continues on aspirin.  Continues on atorvastatin 40 mg daily without myalgias.  Blood pressure today 130/80. Monitors at home any typically 110-120/80s. Returns to PCP 3/3 for follow up for ongoing monitoring/management of HTN and HLD.  Denies  new or worsening stroke/TIA symptoms.  No further concerns at this time.      ROS:   14 system review of systems performed and negative with exception of weakness  PMH:  Past Medical History:  Diagnosis Date  . Arthritis   . GERD (gastroesophageal reflux disease)     PSH:  Past Surgical History:  Procedure Laterality Date  . NO PAST SURGERIES    . TOTAL HIP ARTHROPLASTY Right 05/22/2013   Procedure: TOTAL HIP ARTHROPLASTY- right ;  Surgeon: Loreta Ave, MD;  Location: [facility] OR;  Service: Orthopedics;  Laterality: Right;    Social History:  Social History   Socioeconomic History  . Marital status: Single    Spouse name: Not on file  . Number of children: Not on file  . Years of education: Not on file  . Highest education level: Not on file  Occupational History  . Not on file  Tobacco Use  . Smoking status: Never Smoker  . Smokeless tobacco: Never Used  Substance and Sexual Activity  . Alcohol use: Not Currently    Comment: occ  . Drug use: No  . Sexual activity: Not on file  Other Topics Concern  . Not on file  Social History Narrative  . Not on file   Social Determinants of Health   Financial Resource Strain:   . Difficulty of Paying Living Expenses: Not on file  Food Insecurity:   . Worried About Programme researcher, broadcasting/film/video in the Last Year: Not on file  . Ran Out of Food in the  Last Year: Not on file  Transportation Needs: No Transportation Needs  . Lack of Transportation (Medical): No  . Lack of Transportation (Non-Medical): No  Physical Activity:   . Days of Exercise per Week: Not on file  . Minutes of Exercise per Session: Not on file  Stress:   . Feeling of Stress : Not on file  Social Connections:   . Frequency of Communication with Friends and Family: Not on file  . Frequency of Social Gatherings with Friends and Family: Not on file  . Attends Religious Services: Not on file  . Active Member of Clubs or Organizations: Not on file  . Attends Tax inspector Meetings: Not on file  . Marital Status: Not on file  Intimate Partner Violence:   . Fear of Current or Ex-Partner: Not on file  . Emotionally Abused: Not on file  . Physically Abused: Not on file  . Sexually Abused: Not on file    Family History: History reviewed. No pertinent family history.  Medications:   Current Outpatient Medications on File Prior to Visit  Medication Sig Dispense Refill  . aspirin EC 81 MG EC tablet Take 1 tablet (81 mg total) by mouth daily. 90 tablet 2  . atorvastatin (LIPITOR) 40 MG tablet Take 1 tablet (40 mg total) by mouth daily at 6 PM. 90 tablet 2  . benazepril-hydrochlorthiazide (LOTENSIN HCT) 20-12.5 MG tablet Take 1 tablet by mouth daily. 30 tablet 11  . Cholecalciferol (VITAMIN D-3) 25 MCG (1000 UT) CAPS Take 1,000 Units by mouth daily with breakfast.     . Cyanocobalamin (VITAMIN B-12 PO) Take 1 tablet by mouth daily with breakfast.     . Multiple Vitamins-Minerals (ONE-A-DAY WOMENS 50+ ADVANTAGE) TABS Take 1 tablet by mouth daily with breakfast.     No current facility-administered medications on file prior to visit.    Allergies:   Allergies  Allergen Reactions  . Glucosamine Anaphylaxis, Shortness Of Breath and Swelling  . Iodine Anaphylaxis, Shortness Of Breath and Swelling  . Shellfish-Derived Products Anaphylaxis, Shortness Of Breath and Swelling     Physical Exam  Vitals:   01/21/20 0959  BP: 130/80  Pulse: (!) 57  Temp: (!) 96.8 F (36 C)  TempSrc: Oral  Weight: 230 lb 12.8 oz (104.7 kg)  Height: 5\' 6"  (1.676 m)   Body mass index is 37.25 kg/m. No exam data present  Depression screen [facility] 2/9 01/21/2020  Decreased Interest 0  Down, Depressed, Hopeless 0  PHQ - 2 Score 0     General: Obese pleasant middle-age African-American female, seated, in no evident distress Head: head normocephalic and atraumatic.   Neck: supple with no carotid or supraclavicular bruits Cardiovascular: regular rate and rhythm, no  murmurs Musculoskeletal: no deformity Skin:  no rash/petichiae Vascular:  Normal pulses all extremities   Neurologic Exam Mental Status: Awake and fully alert. normal speech and language. Oriented to place and time. Recent and remote memory intact. Attention span, concentration and fund of knowledge appropriate. Mood and affect appropriate.  Cranial Nerves: Fundoscopic exam reveals sharp disc margins. Pupils equal, briskly reactive to light. Extraocular movements full without nystagmus. Visual fields full to confrontation. Hearing intact. Facial sensation intact. Face, tongue, palate moves normally and symmetrically.  Motor: Normal bulk and tone.  LUE: 4/5 with mildly weak grip strength and decreased finger dexterity LLE: 5/5 Full strength right upper and lower extremity Sensory.: intact to touch , pinprick , position and vibratory sensation.  Coordination: Rapid alternating movements normal  in all extremities except mildly decreased left hand. Finger-to-nose and heel-to-shin performed accurately bilaterally. Gait and Station: Arises from chair without difficulty. Stance is normal. Gait demonstrates  moderate favoring of left leg and mild difficulty with turns but ambulates without device.  Able to tandem walk with mild difficulty Reflexes: 1+ and symmetric. Toes downgoing.     NIHSS  0 Modified Rankin  2   Diagnostic Data (Labs, Imaging, Testing)   CT head No acute abnormality. Moderate small vessel disease. old L centrum semiovale lacune   MRI / MRA  R parietal white matter infarct. Moderate small vessel disease. Moderate distal L VA stenosis   Carotid Doppler unremarkable  2D Echo EF 60 to 65%  LDL 118  HgbA1c 6.1    ASSESSMENT: [name] is a 69 y.o. year old female presented with 3-day history of left-sided weakness on 12/18/2019 with stroke work-up revealing R CR/SO infarct secondary to small vessel disease source. Vascular risk factors include HTN and HLD.   Residual deficits of LUE weakness and gait impairment with ongoing improvement    PLAN:  1. R CR/SO stroke: Continue aspirin 81 mg daily  and atorvastatin 40 mg daily for secondary stroke prevention. Maintain strict control of hypertension with blood pressure goal below 130/90, diabetes with hemoglobin A1c goal below 6.5% and cholesterol with LDL cholesterol (bad cholesterol) goal below 70 mg/dL.  I also advised the patient to eat a healthy diet with plenty of whole grains, cereals, fruits and vegetables, exercise regularly with at least 30 minutes of continuous activity daily and maintain ideal body weight. 2. HTN: Advised to continue current treatment regimen.  Today's BP stable.  Advised to continue to monitor at home along with continued follow-up with PCP for management 3. HLD: Advised to continue current treatment regimen along with continued follow-up with PCP for future prescribing and monitoring of lipid panel.  Request lipid panel at follow-up visit with PCP in the near future 4. Left hemiparesis, poststroke: Advised to continue participation in PT/OT for ongoing improvement    Follow up in 4 months or call earlier if needed   Greater than 50% of time during this 45 minute visit was spent on counseling, explanation of diagnosis of R CR/SO stroke, reviewing risk factor management of HTN and HLD, planning of further management along with potential future management, and discussion with patient answering all questions.    Frann Rider, AGNP-BC  [facility] [address] [place], [place] [address]  Phone [phone] Fax [phone] Note: This document was prepared with digital dictation and possible smart phrase technology. Any transcriptional errors that result from this process are unintentional.

## 2020-01-22 ENCOUNTER — Other Ambulatory Visit: Payer: Self-pay | Admitting: *Deleted

## 2020-01-22 ENCOUNTER — Ambulatory Visit: Payer: Self-pay

## 2020-01-22 ENCOUNTER — Telehealth (HOSPITAL_COMMUNITY): Payer: Self-pay

## 2020-01-22 NOTE — Patient Outreach (Signed)
[facility]) Care Management  01/22/2020  [name] [birth year] [id number]   Subjective: Telephone call to patient's home / mobile number, spoke with patient, and HIPAA verified.  Patient states she remembers speaking with this RNCM in the past.  States she is doing well, feeling much better, less fatigue, participating in outpatient therapy, and making good progress.   States she went to neurologist follow up appointment on 01/21/2020, appointment went well,  provider very pleased with patient's overall progress, progress with therapies, and stroke recovery.  States she forgot to ask neurologist provider about driving, if it ok for her to resume driving, advised to call provider's office to inquire, patient voices understanding,  and is planning to follow up with provider's office today regarding her question.   Patient states she does not have any education material, EMMI follow up, care coordination, care management, disease monitoring, transportation, community resource, or pharmacy needs at this time. States she is very appreciative of the follow up, is in agreement  to receive 1 additional follow up call to assess for further CM needs, and is in agreement to receive [facility] Management EMMI follow up calls as needed.    Objective:Per KPN (Knowledge Performance Now, point of care tool) and chart review,patient hospitalized 12/18/2019 - 12/20/2019 forAcute CVA (cerebrovascular accident),with a left-sided weakness,numbness to her left arm,and leg.Patient also has a history of hypertension and arthritis.     Assessment: Received BCBS PPO EMMI Stroke Red Flag Alert follow up referral on 12/31/2019. Red Flag Alert Trigger, Day #9, patient answered yes to the following question: Feeling worse overall? EMMI follow up completed and will follow up to assess further care management needs.   Patient has been referred to Child psychotherapist, asked Social Worker to send [employer], and follow up on document completion.      Plan: RNCM will call patient for telephone outreach attempt, within 10 business days, EMMI follow up, to assess for further CM needs, and proceed with case closure, within 10 business days if no return call.    [name] H. Gardiner Barefoot, BSN, CCM [facility] Care Management [facility] Telephonic CM Phone: [phone] Fax: [phone]

## 2020-01-22 NOTE — Telephone Encounter (Signed)
called the pt to cancel her appt.due to the weather pt is aware

## 2020-01-23 ENCOUNTER — Encounter: Payer: Self-pay | Admitting: *Deleted

## 2020-01-23 ENCOUNTER — Ambulatory Visit (HOSPITAL_COMMUNITY): Payer: Medicare Other | Admitting: Physical Therapy

## 2020-01-23 ENCOUNTER — Other Ambulatory Visit: Payer: Self-pay | Admitting: *Deleted

## 2020-01-23 ENCOUNTER — Ambulatory Visit (HOSPITAL_COMMUNITY): Payer: Medicare Other

## 2020-01-23 NOTE — Progress Notes (Signed)
I agree with the above plan 

## 2020-01-23 NOTE — Patient Outreach (Signed)
[facility]) Care Management  01/23/2020  [name] 1951/05/10 [id number]   CSW was able to make initial contact with patient today to perform the phone assessment, as well as assess and assist with social work needs and services.  CSW introduced self, explained role and types of services provided through [facility] ([facility]).  CSW further explained to patient that CSW works with [employer], social work Social worker, also with [facility].  CSW then explained the reason for the call, indicating that [name] wanted CSW to follow-up with patient to ensure that she received the [place] ([place] documents) mailed to her home, as well as assist with completion, if necessary.  CSW obtained two HIPAA compliant identifiers from patient, which included patient's name and date of birth.  Patient admitted to receiving the packet, after two attempts, but denied having had an opportunity to complete the documents.  CSW offered to assist patient with document completion over the phone, as well as answer any questions that she may have pertaining to the material, but patient denied, indicating that she will go through the documents with her husband, [name], wanting to ensure that [name] understand her wishes, in the event that she is unable to make decisions for herself.  CSW voiced understanding, encouraging patient to contact CSW directly if she changes her mind or if she has questions once she begins the completion process.  Patient was encouraged to make a copy of the completed, signed and notarized documents to provide to her Primary Care Physician, Dr. Judd Gay to scan into her electronic medical record.  CSW will perform a case closure on patient, as all goals of treatment have been met from social work standpoint and no additional social work needs have been  identified at this time.  CSW will notify patient's Telephonic RNCM with [place] [facility], [name] of CSW's plans to close patient's case.  CSW will fax an update to patient's Primary Care Physician, Dr. Judd Gay to ensure that they are aware of CSW's involvement with patient's plan of care.  CSW was able to confirm that patient has the correct contact information for CSW.  CSW also reminded patient that there are a list of [place] affiliated facilities in the back of the Advanced Directives packet that are able to notarize the documents for her, free of charge.    [name], BSW, MSW, LCSW  Licensed Education officer, [facility]  Mailing [place] [address] [place], [place] [address] Physical Address-[address], [place], [place] [address] Toll Free Main # [phone] Fax # [phone] Cell # [phone]  Office # [phone] [name]

## 2020-01-27 ENCOUNTER — Ambulatory Visit (HOSPITAL_COMMUNITY): Payer: Medicare Other | Admitting: Physical Therapy

## 2020-01-27 ENCOUNTER — Encounter (INDEPENDENT_AMBULATORY_CARE_PROVIDER_SITE_OTHER): Payer: Self-pay

## 2020-01-27 ENCOUNTER — Telehealth (HOSPITAL_COMMUNITY): Payer: Self-pay | Admitting: Physical Therapy

## 2020-01-27 ENCOUNTER — Encounter (HOSPITAL_COMMUNITY): Payer: Self-pay

## 2020-01-27 ENCOUNTER — Encounter (HOSPITAL_COMMUNITY): Payer: Self-pay | Admitting: Physical Therapy

## 2020-01-27 ENCOUNTER — Other Ambulatory Visit: Payer: Self-pay

## 2020-01-27 ENCOUNTER — Ambulatory Visit (HOSPITAL_COMMUNITY): Payer: Medicare Other

## 2020-01-27 DIAGNOSIS — R2689 Other abnormalities of gait and mobility: Secondary | ICD-10-CM

## 2020-01-27 DIAGNOSIS — R29818 Other symptoms and signs involving the nervous system: Secondary | ICD-10-CM

## 2020-01-27 DIAGNOSIS — M6281 Muscle weakness (generalized): Secondary | ICD-10-CM

## 2020-01-27 DIAGNOSIS — R29898 Other symptoms and signs involving the musculoskeletal system: Secondary | ICD-10-CM

## 2020-01-27 DIAGNOSIS — R278 Other lack of coordination: Secondary | ICD-10-CM

## 2020-01-27 NOTE — Telephone Encounter (Signed)
Called pt re missed appointment.   Pt thought that her first appointment was at 9:45; therapist told pt  That she does have an OT appointment at 9:45.  Virgina Organ, PT CLT [phone]

## 2020-01-27 NOTE — Therapy (Signed)
[facility] [address] [place], [place], [address] Phone: [phone]   Fax:  [phone]  Occupational Therapy Treatment  Patient Details  Name: [name] MRN: [id number] Date of Birth: 1951-09-10 Referring Provider (OT): Joycelyn Das MD   Encounter Date: 01/27/2020  OT End of Session - 01/27/20 1007    Visit Number  7    Number of Visits  8    Date for OT Re-Evaluation  01/29/20    Authorization Type  1) BCBS State 2) Medicare    Authorization Time Period  no visit limit. Progress note at visit 10    Authorization - Visit Number  7    Authorization - Number of Visits  10    OT Start Time  0945    OT Stop Time  1023    OT Time Calculation (min)  38 min    Activity Tolerance  Patient tolerated treatment well    Behavior During Therapy  WFL for tasks assessed/performed       Past Medical History:  Diagnosis Date  . Arthritis   . GERD (gastroesophageal reflux disease)     Past Surgical History:  Procedure Laterality Date  . NO PAST SURGERIES    . TOTAL HIP ARTHROPLASTY Right 05/22/2013   Procedure: TOTAL HIP ARTHROPLASTY- right ;  Surgeon: Loreta Ave, MD;  Location: [facility] OR;  Service: Orthopedics;  Laterality: Right;    There were no vitals filed for this visit.  Subjective Assessment - 01/27/20 0948    Subjective   S: I'm feeling a still stiff from all this rain.    Currently in Pain?  No/denies         [facility] OT Assessment - 01/27/20 0950      Assessment   Medical Diagnosis  Acute CVA      Precautions   Precautions  None               OT Treatments/Exercises (OP) - 01/27/20 0950      Exercises   Exercises  Shoulder;Elbow;Theraputty      Additional Elbow Exercises   Sponges  11,14    Theraputty - Roll  red    Hand Gripper with Large Beads  all beads gripper at 29#   horizontal   Hand Gripper with Medium Beads  all beads with gripper at 29#   horizontal   Hand Gripper with Small  Beads  all beads with gripper set at 25#   horizontal     Hand Exercises   Other Hand Exercises  utilized green resistive clothespins and 3 point pinch to pick up and transfer 25 sponges from table to container.       Theraputty   Theraputty - Pinch  red- lateral and 3 point with VC and direct supervison for technique    Theraputty Hand- Locate Pegs  red- 8 beads located               OT Short Term Goals - 01/02/20 1024      OT SHORT TERM GOAL #1   Title  Patient will be educated and independent with HEP in order to faciliate her progress in therapy and allow her to increase her use of her LUE during daily and leisure tasks.    Time  4    Period  Weeks    Status  On-going    Target Date  01/29/20      OT SHORT TERM GOAL #2  Title  Patient will increase her left hand grip strength by 10# and her pinch strength by 4# in order to increase her ability to hold onto weighted household items without dropping.    Time  4    Period  Weeks    Status  On-going      OT SHORT TERM GOAL #3   Title  Patient will increase her left hand coordination by completing the 9 hole peg test in 23" or less.    Time  4    Period  Weeks    Status  On-going      OT SHORT TERM GOAL #4   Title  Patient will increase her LUE shoulder strength to 4+/5 in order to complete normal household lifting activities with less difficulty.    Time  4    Period  Weeks    Status  On-going               Plan - 01/27/20 1029    Clinical Impression Statement  A: Focused on hand strengthening during session. Patient remained at current resistance for hand gripper activities and required VC for form and technique. Did require rest breaks periodically due to hand fatigue.    Body Structure / Function / Physical Skills  ADL;UE functional use;FMC;Coordination;GMC;Strength;IADL    Plan  P: Reassessment for possible discharge.    Consulted and Agree with Plan of Care  Patient       Patient will benefit  from skilled therapeutic intervention in order to improve the following deficits and impairments:   Body Structure / Function / Physical Skills: ADL, UE functional use, FMC, Coordination, GMC, Strength, IADL       Visit Diagnosis: Other lack of coordination  Other symptoms and signs involving the musculoskeletal system  Other symptoms and signs involving the nervous system    Problem List Patient Active Problem List   Diagnosis Date Noted  . Acute CVA (cerebrovascular accident) ([place]) 12/18/2019  . Allergy or intolerance to drug 05/27/2013  . Postoperative anemia due to acute blood loss 05/27/2013  . Osteoarthritis of right hip 05/24/2013     Ailene Ravel, OTR/L,CBIS  [phone]  01/27/2020, 10:30 AM  [place], [place], [address] Phone: [phone]   Fax:  [phone]  Name: [name] MRN: [id number] Date of Birth: 11-Jul-1951

## 2020-01-27 NOTE — Therapy (Signed)
[facility] [address] [place], [place], [address] Phone: [phone]   Fax:  [phone]  Physical Therapy Treatment  Patient Details  Name: [name] MRN: [id number] Date of Birth: [birth year] Referring Provider (PT): Joycelyn Das MD   Encounter Date: 01/27/2020  PT End of Session - 01/27/20 1030    Visit Number  7    Number of Visits  12    Date for PT Re-Evaluation  02/11/20    Authorization Type  BCBS state health plan Secondary: medicare (PT/OT/ST No visit limit, no auth required)    Authorization Time Period  01/03/20-02/11/20    Authorization - Visit Number  7    Authorization - Number of Visits  10    PT Start Time  1028    PT Stop Time  1110    PT Time Calculation (min)  42 min    Equipment Utilized During Treatment  Gait belt    Activity Tolerance  Patient tolerated treatment well    Behavior During Therapy  [facility] for tasks assessed/performed       Past Medical History:  Diagnosis Date  . Arthritis   . GERD (gastroesophageal reflux disease)     Past Surgical History:  Procedure Laterality Date  . NO PAST SURGERIES    . TOTAL HIP ARTHROPLASTY Right 05/22/2013   Procedure: TOTAL HIP ARTHROPLASTY- right ;  Surgeon: Loreta Ave, MD;  Location: [facility] OR;  Service: Orthopedics;  Laterality: Right;    There were no vitals filed for this visit.  Subjective Assessment - 01/27/20 1031    Subjective  Patient reports no new complaints, says she is doing well. Had a follow up with neurologist and they are pleased with her progress. Patient reports no pain currently.    Patient Stated Goals  "come back better than she was before".    Currently in Pain?  No/denies                       [facility] Adult PT Treatment/Exercise - 01/27/20 1034      Knee/Hip Exercises: Stretches   Gastroc Stretch  Both;3 reps;30 seconds    Gastroc Stretch Limitations  slant board       Knee/Hip Exercises: Standing   Heel  Raises  Both;20 reps    Hip Abduction  Both;2 sets;10 reps;Knee straight    Abduction Limitations  RTB    Hip Extension  Both;2 sets;10 reps;Knee straight    Extension Limitations  RTB    Stairs  3RT reciprocal pattern 7in 1 HR    Gait Training  452 feet with no AD    Other Standing Knee Exercises  tandem stance foam x30" each; head turns in staggered stance, foam pad, 10x each, intermittent HHA    Other Standing Knee Exercises  sidestep 2RT RTB; tandem gait on balance beam; hurdles 2 RT 4 inch  fwd; 2 RT hurdles 4 inch sidestepping       Knee/Hip Exercises: Seated   Sit to Sand  2 sets;10 reps;without UE support               PT Short Term Goals - 01/08/20 1415      PT SHORT TERM GOAL #1   Title  Patient will be independent with HEP in order to improve functional outcomes.    Time  3    Period  Weeks    Status  On-going    Target Date  01/21/20  PT Long Term Goals - 01/08/20 1415      PT LONG TERM GOAL #1   Title  Patient will improve FOTO by at least 10% in order to demonstrate improved tolerance to activity.    Time  6    Period  Weeks    Status  On-going      PT LONG TERM GOAL #2   Title  Patient will report at least 50% improvement in symptoms in order to improve quality of life.    Time  6    Period  Weeks    Status  On-going      PT LONG TERM GOAL #3   Title  Patient will be able to ambulate at least 400 feet in in order to improve gait speed for ambulating in the community.    Time  6    Period  Weeks    Status  On-going      PT LONG TERM GOAL #4   Title  Patient will improve 5xSTS time to <11.4 seconds in order to reduce the risk of falls.    Time  6    Period  Weeks    Status  On-going            Plan - 01/27/20 1113    Clinical Impression Statement  Patient tolerated session well today. Progressed dynamic balance. Added tandem gait on balance beam, increased reps with head turns in tandem on foam pad. Also added fwd and  lateral ambulation over 4 inch hurdles. Patient was well challenged with tandem gait on balance beam and required intermittent HHA and CG for safety. Patient did well with fwd ambulation over hurdles, but had slightly more difficulty with proper ground clearance with lateral step overs. Patient cued on increased step height for improved ground clearance. Patient noted mild fatigue post session but no pain.    Personal Factors and Comorbidities  Fitness;Comorbidity 1    Comorbidities  hypertension    Examination-Activity Limitations  Locomotion Level;Carry;Squat;Stairs;Transfers    Examination-Participation Restrictions  Cleaning;Volunteer;Shop;Yard Work    Stability/Clinical Decision Making  Stable/Uncomplicated    Rehab Potential  Good    PT Frequency  2x / week    PT Duration  6 weeks    PT Treatment/Interventions  ADLs/Self Care Home Management;Aquatic Therapy;Biofeedback;Cryotherapy;Electrical Stimulation;Iontophoresis 4mg /ml Dexamethasone;Moist Heat;Traction;Ultrasound;DME Instruction;Gait training;Stair training;Functional mobility training;Therapeutic activities;Therapeutic exercise;Balance training;Neuromuscular re-education;Patient/family education;Orthotic Fit/Training;Manual techniques;Manual lymph drainage;Compression bandaging;Passive range of motion;Dry needling;Energy conservation;Taping;Vasopneumatic Device;Spinal Manipulations;Joint Manipulations    PT Next Visit Plan  Continue to progress gait, balance and functional BLE strengthening as tolerated. Add head turns with gait for dynamic balance next visit.    PT Home Exercise Plan  01/02/20: sit to stand, heel raises, tandem stance at counter    Consulted and Agree with Plan of Care  Patient       Patient will benefit from skilled therapeutic intervention in order to improve the following deficits and impairments:  Abnormal gait, Decreased activity tolerance, Decreased balance, Decreased endurance, Decreased knowledge of use of DME,  Decreased mobility, Decreased range of motion, Decreased strength, Difficulty walking, Improper body mechanics  Visit Diagnosis: Other symptoms and signs involving the nervous system  Other abnormalities of gait and mobility  Muscle weakness (generalized)     Problem List Patient Active Problem List   Diagnosis Date Noted  . Acute CVA (cerebrovascular accident) ([facility]) 12/18/2019  . Allergy or intolerance to drug 05/27/2013  . Postoperative anemia due to acute blood loss 05/27/2013  . Osteoarthritis  of right hip 05/24/2013   11:15 AM, 01/27/20 Josue Hector PT DPT  Physical Therapist with [facility]  [phone]   [facility] [address] [place], [place], [address] Phone: [phone]   Fax:  [phone]  Name: KIEREN RICCI MRN: [id number] Date of Birth: [birth year]

## 2020-01-28 ENCOUNTER — Ambulatory Visit: Payer: Self-pay | Admitting: *Deleted

## 2020-01-30 ENCOUNTER — Other Ambulatory Visit: Payer: Self-pay

## 2020-01-30 ENCOUNTER — Ambulatory Visit (HOSPITAL_COMMUNITY): Payer: Medicare Other | Admitting: Physical Therapy

## 2020-01-30 ENCOUNTER — Encounter (HOSPITAL_COMMUNITY): Payer: Self-pay

## 2020-01-30 ENCOUNTER — Encounter (HOSPITAL_COMMUNITY): Payer: Self-pay | Admitting: Physical Therapy

## 2020-01-30 ENCOUNTER — Ambulatory Visit (HOSPITAL_COMMUNITY): Payer: Medicare Other

## 2020-01-30 DIAGNOSIS — R29898 Other symptoms and signs involving the musculoskeletal system: Secondary | ICD-10-CM

## 2020-01-30 DIAGNOSIS — M6281 Muscle weakness (generalized): Secondary | ICD-10-CM

## 2020-01-30 DIAGNOSIS — R2689 Other abnormalities of gait and mobility: Secondary | ICD-10-CM | POA: Diagnosis not present

## 2020-01-30 DIAGNOSIS — R278 Other lack of coordination: Secondary | ICD-10-CM

## 2020-01-30 DIAGNOSIS — R29818 Other symptoms and signs involving the nervous system: Secondary | ICD-10-CM

## 2020-01-30 NOTE — Therapy (Signed)
[place], [place], [address] Phone: [phone]   Fax:  [phone]  Occupational Therapy Treatment Reassessment and discharge summary Patient Details  Name: [name] MRN: [id number] Date of Birth: 08/19/1951 Referring Provider (OT): Flora Lipps MD   Encounter Date: 01/30/2020  OT End of Session - 01/30/20 0935    Visit Number  8    Number of Visits  8    Authorization Type  1) BCBS State 2) Medicare    Authorization Time Period  no visit limit. Progress note at visit 10    Authorization - Visit Number  8    Authorization - Number of Visits  10    OT Start Time  0900   reassessment and discharge   OT Stop Time  0927    OT Time Calculation (min)  27 min    Activity Tolerance  Patient tolerated treatment well    Behavior During Therapy  [facility] for tasks assessed/performed       Past Medical History:  Diagnosis Date  . Arthritis   . GERD (gastroesophageal reflux disease)     Past Surgical History:  Procedure Laterality Date  . NO PAST SURGERIES    . TOTAL HIP ARTHROPLASTY Right 05/22/2013   Procedure: TOTAL HIP ARTHROPLASTY- right ;  Surgeon: Ninetta Lights, MD;  Location: [place] [facility];  Service: Orthopedics;  Laterality: Right;    There were no vitals filed for this visit.  Subjective Assessment - 01/30/20 0934    Subjective   S: I have gotten a lot better since I started.    Currently in Pain?  No/denies         [facility] OT Assessment - 01/30/20 0902      Assessment   Medical Diagnosis  Acute CVA      Precautions   Precautions  None      Coordination   Left 9 Hole Peg Test  19.5"   previous; 25.8"     ROM / Strength   AROM / PROM / Strength  Strength      Strength   Strength Assessment Site  Shoulder;Hand    Right/Left Shoulder  Left    Left Shoulder Flexion  4/5   previous: 4-/5   Left Shoulder ABduction  5/5   previous: 4-/5   Left Shoulder Internal Rotation  5/5   previous:  5/5   Left Shoulder External Rotation  4+/5   previous: 4+/5   Left Hand Grip (lbs)  40   previous; 25   Left Hand Lateral Pinch  12 lbs   previous: 14   Left Hand 3 Point Pinch  12 lbs   previous: 12              OT Treatments/Exercises (OP) - 01/30/20 0942      Exercises   Exercises  Shoulder      Shoulder Exercises: Seated   Flexion  Theraband;10 reps    Theraband Level (Shoulder Flexion)  Level 2 (Red)      Shoulder Exercises: Standing   Flexion  Theraband;10 reps    Theraband Level (Shoulder Flexion)  Level 2 (Red)             OT Education - 01/30/20 0934    Education Details  reviewed goals and progress in therapy. Added shoulder flexion strengthening with theraband (sitting or standing) to HEP. Recommended that patient continue with red theraputty exercises to continue to work on grip strengthening.  Person(s) Educated  Patient    Methods  Explanation;Demonstration;Handout;Verbal cues;Tactile cues    Comprehension  Returned demonstration;Verbalized understanding       OT Short Term Goals - 01/30/20 0913      OT SHORT TERM GOAL #1   Title  Patient will be educated and independent with HEP in order to faciliate her progress in therapy and allow her to increase her use of her LUE during daily and leisure tasks.    Time  4    Period  Weeks    Status  Achieved    Target Date  01/29/20      OT SHORT TERM GOAL #2   Title  Patient will increase her left hand grip strength by 10# and her pinch strength by 4# in order to increase her ability to hold onto weighted household items without dropping.    Baseline  Pinch did not change although within or better than average norms. 01/30/20    Time  4    Period  Weeks    Status  Achieved      OT SHORT TERM GOAL #3   Title  Patient will increase her left hand coordination by completing the 9 hole peg test in 23" or less.    Time  4    Period  Weeks    Status  Achieved      OT SHORT TERM GOAL #4   Title   Patient will increase her LUE shoulder strength to 4+/5 in order to complete normal household lifting activities with less difficulty.    Baseline  4/5 for flexion 01/30/20    Time  4    Period  Weeks    Status  Achieved               Plan - 01/30/20 0936    Clinical Impression Statement  A: Reassessment completed this date. patient has met all therapy goals. She continues to have mild weakness in her Left shoulder when tested for flexion and her grip strength is slightly below average norms. Overall, patient reports that she has improved greatly and her strength is better than it was. HEP was reviewed and updated. Patient is in agreement with discharge recommendation. Pt is requesting her discharge note be sent to Dr. Erlinda Hong and Dr. Pleas Koch.    Body Structure / Function / Physical Skills  ADL;UE functional use;FMC;Coordination;GMC;Strength;IADL    Plan  P: D/C from OT services with HEP.    Consulted and Agree with Plan of Care  Patient       Patient will benefit from skilled therapeutic intervention in order to improve the following deficits and impairments:   Body Structure / Function / Physical Skills: ADL, UE functional use, FMC, Coordination, GMC, Strength, IADL       Visit Diagnosis: Other lack of coordination  Other symptoms and signs involving the musculoskeletal system    Problem List Patient Active Problem List   Diagnosis Date Noted  . Acute CVA (cerebrovascular accident) ([place]) 12/18/2019  . Allergy or intolerance to drug 05/27/2013  . Postoperative anemia due to acute blood loss 05/27/2013  . Osteoarthritis of right hip 05/24/2013   OCCUPATIONAL THERAPY DISCHARGE SUMMARY  Visits from Start of Care: 8  Current functional level related to goals / functional outcomes: See above   Remaining deficits: See above   Education / Equipment: See above Plan: Patient agrees to discharge.  Patient goals were met. Patient is being discharged due to meeting the stated  rehab  goals.  ?????         Ailene Ravel, OTR/L,CBIS  [phone]  01/30/2020, 9:43 AM  [place], [place], [address] Phone: [phone]   Fax:  [phone]  Name: [name] MRN: [id number] Date of Birth: 1951/05/20

## 2020-01-30 NOTE — Patient Instructions (Signed)
Access Code: [email or web address]  URL: [email or web address]  Date: 01/30/2020  Prepared by: Greig Castilla Kiana Hollar   Exercises Lateral Step Down - 10 reps - 2 sets - 1x daily - 7x weekly Single Leg Stance - 3 reps - 30 seconds hold - 1x daily - 7x weekly

## 2020-01-30 NOTE — Patient Instructions (Signed)
Shoulder Flexion - Theraband  Place one end of the theraband under your foot and one in your hand.  Keeping elbow straight, raise arm straight out in front.       OR  Shoulder Flexion with Resistance Band  Start by holding an elastic band on your knee with the stabalizing arm.   While seated and holding an elastic band with both hands, lift your hand over your head in front while keeping your arm straight. Bring your hand back down to your knee while continuing to keep your arm straing. Then repeat the movement.  Adjust the slack in the band to adjust the challeneg as needed.

## 2020-01-30 NOTE — Therapy (Signed)
[facility] [address] [place], [place], [address] Phone: [phone]   Fax:  [phone]  Physical Therapy Treatment  Patient Details  Name: [name] MRN: [id number] Date of Birth: 19-Dec-1950 Referring Provider (PT): Joycelyn Das MD   Encounter Date: 01/30/2020  PT End of Session - 01/30/20 1443    Visit Number  8    Number of Visits  12    Date for PT Re-Evaluation  02/11/20    Authorization Type  BCBS state health plan Secondary: medicare (PT/OT/ST No visit limit, no auth required)    Authorization Time Period  01/03/20-02/11/20    Authorization - Visit Number  8    Authorization - Number of Visits  10    PT Start Time  0818    PT Stop Time  0858    PT Time Calculation (min)  40 min    Equipment Utilized During Treatment  Gait belt    Activity Tolerance  Patient tolerated treatment well    Behavior During Therapy  [facility] for tasks assessed/performed       Past Medical History:  Diagnosis Date  . Arthritis   . GERD (gastroesophageal reflux disease)     Past Surgical History:  Procedure Laterality Date  . NO PAST SURGERIES    . TOTAL HIP ARTHROPLASTY Right 05/22/2013   Procedure: TOTAL HIP ARTHROPLASTY- right ;  Surgeon: Loreta Ave, MD;  Location: [facility] OR;  Service: Orthopedics;  Laterality: Right;    There were no vitals filed for this visit.  Subjective Assessment - 01/30/20 0818    Subjective  Patient states everything has been going well. Her MD is pleased with progress. She has most difficulty with walking and feels like her walking speed is slow. She is slow to get started.    Patient Stated Goals  "come back better than she was before".    Currently in Pain?  No/denies                       [facility] Adult PT Treatment/Exercise - 01/30/20 0001      Ambulation/Gait   Ambulation/Gait  Yes    Ambulation/Gait Assistance  6: Modified independent (Device/Increase time)    Ambulation Distance  (Feet)  450 Feet    Assistive device  None    Gait Comments  with head turns R/L/up/ down      Knee/Hip Exercises: Standing   Lateral Step Up  2 sets;10 reps;Step Height: 6";Hand Hold: 2;Both    Forward Step Up  1 set;10 reps;Hand Hold: 1    Gait Training  450 feet without AD and head turns    Other Standing Knee Exercises  tandem gait on foam beam 2 RT, lateral 2 RT    Other Standing Knee Exercises  SLS bilateral LE 2x30 seconds with intermittent UE support      Knee/Hip Exercises: Seated   Sit to Sand  2 sets;10 reps;without UE support             PT Education - 01/30/20 0822    Education Details  Patient educated on completing HEP and increasing reps at home    Person(s) Educated  Patient    Methods  Explanation    Comprehension  Verbalized understanding       PT Short Term Goals - 01/08/20 1415      PT SHORT TERM GOAL #1   Title  Patient will be independent with HEP in order  to improve functional outcomes.    Time  3    Period  Weeks    Status  On-going    Target Date  01/21/20        PT Long Term Goals - 01/08/20 1415      PT LONG TERM GOAL #1   Title  Patient will improve FOTO by at least 10% in order to demonstrate improved tolerance to activity.    Time  6    Period  Weeks    Status  On-going      PT LONG TERM GOAL #2   Title  Patient will report at least 50% improvement in symptoms in order to improve quality of life.    Time  6    Period  Weeks    Status  On-going      PT LONG TERM GOAL #3   Title  Patient will be able to ambulate at least 400 feet in 2MWT in order to improve gait speed for ambulating in the community.    Time  6    Period  Weeks    Status  On-going      PT LONG TERM GOAL #4   Title  Patient will improve 5xSTS time to <11.4 seconds in order to reduce the risk of falls.    Time  6    Period  Weeks    Status  On-going            Plan - 01/30/20 5809    Clinical Impression Statement  Patient demonstrates min  unsteadiness and occasional veering L and R with head turns but does not have loss of balance. She ambulates with improving gait speed and mechanics today. She is able to increase number of reps of sit to stand exercise before she fatigues. She has several losses of balance with dynamic balance on balance beam that requires intermittent UE support and min assist for balance and safety. She is able to complete forward step up without c/o difficulty but fatigues quickly with lateral step down. She is educated on increasing number of reps completed for HEP. She requires some seated rest breaks during session. Patient will continue to benefit from skilled physical therapy in order to improve function and reduce impairment.    Personal Factors and Comorbidities  Fitness;Comorbidity 1    Comorbidities  hypertension    Examination-Activity Limitations  Locomotion Level;Carry;Squat;Stairs;Transfers    Examination-Participation Restrictions  Cleaning;Volunteer;Shop;Yard Work    Stability/Clinical Decision Making  Stable/Uncomplicated    Rehab Potential  Good    PT Frequency  2x / week    PT Duration  6 weeks    PT Treatment/Interventions  ADLs/Self Care Home Management;Aquatic Therapy;Biofeedback;Cryotherapy;Electrical Stimulation;Iontophoresis 4mg /ml Dexamethasone;Moist Heat;Traction;Ultrasound;DME Instruction;Gait training;Stair training;Functional mobility training;Therapeutic activities;Therapeutic exercise;Balance training;Neuromuscular re-education;Patient/family education;Orthotic Fit/Training;Manual techniques;Manual lymph drainage;Compression bandaging;Passive range of motion;Dry needling;Energy conservation;Taping;Vasopneumatic Device;Spinal Manipulations;Joint Manipulations    PT Next Visit Plan  Continue to progress gait, balance and functional BLE strengthening as tolerated. Anticipate D/c next week.    PT Home Exercise Plan  01/02/20: sit to stand, heel raises, tandem stance at counter 01/30/20 lateral  step up, SLS    Consulted and Agree with Plan of Care  Patient       Patient will benefit from skilled therapeutic intervention in order to improve the following deficits and impairments:  Abnormal gait, Decreased activity tolerance, Decreased balance, Decreased endurance, Decreased knowledge of use of DME, Decreased mobility, Decreased range of motion, Decreased strength, Difficulty walking, Improper body mechanics  Visit Diagnosis: Other symptoms and signs involving the nervous system  Other abnormalities of gait and mobility  Muscle weakness (generalized)     Problem List Patient Active Problem List   Diagnosis Date Noted  . Acute CVA (cerebrovascular accident) (HCC) 12/18/2019  . Allergy or intolerance to drug 05/27/2013  . Postoperative anemia due to acute blood loss 05/27/2013  . Osteoarthritis of right hip 05/24/2013    9:03 AM, 01/30/20 Wyman Songster PT, DPT Physical Therapist at [facility]  [facility] [address] [place], [place], [address] Phone: [phone]   Fax:  [phone]  Name: CURTINA GRILLS MRN: [id number] Date of Birth: 20-Feb-1951

## 2020-01-31 ENCOUNTER — Other Ambulatory Visit: Payer: Self-pay | Admitting: *Deleted

## 2020-01-31 NOTE — Patient Outreach (Signed)
[facility]) Care Management  01/31/2020  DELYLA SANDEEN 1951/08/19 [id number]   Subjective: Telephone call to patient's home number, spoke with patient, and HIPAA verified.  Patient states she is doing well, remembers speaking with this RNCM in the past, had a follow up appointment with neurologist,  appointment went well, and today had 2nd Covid vaccine.  States she has completed occupational therapy, physical therapy ongoing, and continues to perform home exercise program without difficulty.  Patient states she does not have any education material, EMMI follow up, care coordination, care management, disease monitoring, transportation, community resource, or pharmacy needs at this time.  States she is very appreciative of the follow up and is aware 30 day EMMI automated calls have been completed.     Objective:Per KPN (Knowledge Performance Now, point of care tool) and chart review,patient hospitalized 12/18/2019 - 12/20/2019 forAcute CVA (cerebrovascular accident),with a left-sided weakness,numbness to her left arm,and leg.Patient also has a history of hypertension and arthritis.     Assessment: Received [employer]BS PPO EMMI Stroke Red Flag Alert follow up referral on 12/31/2019. Red Flag Alert Trigger, Day #9, patient answered yes to the following question: Feeling worse overall?Patient has been referredto Social Worker,asked Child psychotherapist to send [employer], and follow up on document completion.EMMI follow up completed and no further care management needs.     Plan:RNCM will complete case closure due to follow up completed / no care management needs.      Keelan Tripodi H. Gardiner Barefoot, BSN, CCM [facility] Care Management [facility] Telephonic CM Phone: [phone] Fax: [phone]

## 2020-02-03 ENCOUNTER — Ambulatory Visit (HOSPITAL_COMMUNITY): Payer: Medicare Other | Attending: Internal Medicine | Admitting: Physical Therapy

## 2020-02-03 ENCOUNTER — Other Ambulatory Visit: Payer: Self-pay

## 2020-02-03 ENCOUNTER — Encounter (HOSPITAL_COMMUNITY): Payer: Self-pay | Admitting: Physical Therapy

## 2020-02-03 DIAGNOSIS — R29818 Other symptoms and signs involving the nervous system: Secondary | ICD-10-CM | POA: Diagnosis not present

## 2020-02-03 DIAGNOSIS — M6281 Muscle weakness (generalized): Secondary | ICD-10-CM | POA: Diagnosis present

## 2020-02-03 DIAGNOSIS — R2689 Other abnormalities of gait and mobility: Secondary | ICD-10-CM

## 2020-02-03 NOTE — Therapy (Signed)
[place], [place], [address] Phone: [phone]   Fax:  [phone]  Physical Therapy Treatment/Discharge Summary  Patient Details  Name: [name] MRN: [id number] Date of Birth: [birth year] Referring Provider (PT): Flora Lipps MD   Encounter Date: 02/03/2020  PHYSICAL THERAPY DISCHARGE SUMMARY  Visits from Start of Care: 9  Current functional level related to goals / functional outcomes: See below   Remaining deficits: See below   Education / Equipment: See below  Plan: Patient agrees to discharge.  Patient goals were partially met. Patient is being discharged due to being pleased with the current functional level.  ?????       PT End of Session - 02/03/20 0907    Visit Number  9    Number of Visits  12    Date for PT Re-Evaluation  02/11/20    Authorization Type  BCBS state health plan Secondary: medicare (PT/OT/ST No visit limit, no auth required)    Authorization Time Period  01/03/20-02/11/20    Authorization - Visit Number  9    Authorization - Number of Visits  10    PT Start Time  0818    PT Stop Time  7371    PT Time Calculation (min)  39 min    Equipment Utilized During Treatment  Gait belt    Activity Tolerance  Patient tolerated treatment well    Behavior During Therapy  WFL for tasks assessed/performed       Past Medical History:  Diagnosis Date  . Arthritis   . GERD (gastroesophageal reflux disease)     Past Surgical History:  Procedure Laterality Date  . NO PAST SURGERIES    . TOTAL HIP ARTHROPLASTY Right 05/22/2013   Procedure: TOTAL HIP ARTHROPLASTY- right ;  Surgeon: Ninetta Lights, MD;  Location: [facility];  Service: Orthopedics;  Laterality: Right;    There were no vitals filed for this visit.  Subjective Assessment - 02/03/20 0817    Subjective  Patient states everything is going well at home. She feels that she is able to complete all of her stuff at home. She  feels 90% improvement since starting physical therapy. She feels most limited with strength and walking speed. She feels she needs to take her time more.    Patient Stated Goals  "come back better than she was before".    Currently in Pain?  No/denies         [facility] PT Assessment - 02/03/20 0001      Assessment   Medical Diagnosis  Acute CVA    Referring Provider (PT)  Flora Lipps MD    Onset Date/Surgical Date  12/16/19    Hand Dominance  Right    Next MD Visit  April    Prior Therapy  Acute OT only      Precautions   Precautions  None      Restrictions   Weight Bearing Restrictions  No      Balance Screen   Has the patient fallen in the past 6 months  No    Has the patient had a decrease in activity level because of a fear of falling?   No    Is the patient reluctant to leave their home because of a fear of falling?   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       Cognition   Overall Cognitive Status  Within Functional Limits for tasks assessed      Observation/Other Assessments   Observations  Ambulates without AD with improved gait speed    Focus on Therapeutic Outcomes (FOTO)   21% limited      Strength   Right Hip Flexion  4/5    Left Hip Flexion  5/5    Right Knee Flexion  5/5    Right Knee Extension  5/5    Left Knee Flexion  5/5    Left Knee Extension  5/5    Right Ankle Dorsiflexion  5/5    Left Ankle Dorsiflexion  5/5      Transfers   Five time sit to stand comments   14.86 seconds    Comments  no UE use      Ambulation/Gait   Ambulation/Gait  Yes    Ambulation/Gait Assistance  7: Independent    Ambulation Distance (Feet)  470 Feet    Assistive device  None    Gait Pattern  Step-through pattern;Decreased step length - right;Decreased step length - left;Decreased stride  length    Gait Comments  2 MWT                   [facility] Adult PT Treatment/Exercise - 02/03/20 0001      Knee/Hip Exercises: Standing   Other Standing Knee Exercises  tandem gait on foam beam 2 RT, lateral 2 RT    Other Standing Knee Exercises  SLS bilateral LE 4x30 seconds with intermittent UE support             PT Education - 02/03/20 0905    Education Details  Patient educated on continueing HEP, progress made, returning to PT if needed if she has any remaining deficits, and increasing physical activity as able.    Person(s) Educated  Patient    Methods  Explanation    Comprehension  Verbalized understanding       PT Short Term Goals - 02/03/20 0907      PT SHORT TERM GOAL #1   Title  Patient will be independent with HEP in order to improve functional outcomes.    Time  3    Period  Weeks    Status  Achieved    Target Date  01/21/20        PT Long Term Goals - 02/03/20 0908      PT LONG TERM GOAL #1   Title  Patient will improve FOTO by at least 10% in order to demonstrate improved tolerance to activity.    Baseline  improved by 34% to being 21% limited    Time  6    Period  Weeks    Status  Achieved      PT LONG TERM GOAL #2   Title  Patient will report at least 50% improvement in symptoms in order to improve quality of life.    Baseline  90% improvement    Time  6    Period  Weeks    Status  Achieved      PT LONG TERM GOAL #3   Title  Patient will be able to ambulate at least 400 feet in 2MWT in order to improve gait speed for ambulating in the community.    Baseline  02/03/20: 470 feet in 2MWT  Time  6    Period  Weeks    Status  Achieved      PT LONG TERM GOAL #4   Title  Patient will improve 5xSTS time to <11.4 seconds in order to reduce the risk of falls.    Baseline  improved to 14.86 seconds    Time  6    Period  Weeks    Status  Partially Met            Plan - 02/03/20 0910    Clinical Impression Statement  Patient  has met 1/1 short term goals with ability to complete HEP independently. She has met 3/4 long term goals with improved symptoms, improved function and improved gait speed. Remaining goal for improved 5x STS time was not met but she required decreased time compared to when completed at initial evaluation. Patient has demonstrated improved gait, balance, strength, and functional mobility with ADL since beginning therapy. She continues to feel most limited with walking speed, activity tolerance, and mild strength deficits. Patient educated on progress made, continuing HEP, and returning to physical therapy if needed. Patient demonstrates impaired activity tolerance today and fatigues quickly requiring frequent seated rest breaks. Patient discharged from physical therapy at this time.    Personal Factors and Comorbidities  Fitness;Comorbidity 1    Comorbidities  hypertension    Examination-Activity Limitations  Locomotion Level;Carry;Squat;Stairs;Transfers    Examination-Participation Restrictions  Cleaning;Volunteer;Shop;Yard Work    Rehab Potential  Good    PT Treatment/Interventions  ADLs/Self Care Home Management;Aquatic Therapy;Biofeedback;Cryotherapy;Electrical Stimulation;Iontophoresis 45m/ml Dexamethasone;Moist Heat;Traction;Ultrasound;DME Instruction;Gait training;Stair training;Functional mobility training;Therapeutic activities;Therapeutic exercise;Balance training;Neuromuscular re-education;Patient/family education;Orthotic Fit/Training;Manual techniques;Manual lymph drainage;Compression bandaging;Passive range of motion;Dry needling;Energy conservation;Taping;Vasopneumatic Device;Spinal Manipulations;Joint Manipulations    PT Next Visit Plan  n/a    PT Home Exercise Plan  01/02/20: sit to stand, heel raises, tandem stance at counter 01/30/20 lateral step up, SLS    Consulted and Agree with Plan of Care  Patient       Patient will benefit from skilled therapeutic intervention in order to improve  the following deficits and impairments:  Abnormal gait, Decreased activity tolerance, Decreased balance, Decreased endurance, Decreased knowledge of use of DME, Decreased mobility, Decreased range of motion, Decreased strength, Difficulty walking, Improper body mechanics  Visit Diagnosis: Other symptoms and signs involving the nervous system  Other abnormalities of gait and mobility  Muscle weakness (generalized)     Problem List Patient Active Problem List   Diagnosis Date Noted  . Acute CVA (cerebrovascular accident) ([facility] 12/18/2019  . Allergy or intolerance to drug 05/27/2013  . Postoperative anemia due to acute blood loss 05/27/2013  . Osteoarthritis of right hip 05/24/2013    9:17 AM, 02/03/20 AMearl LatinPT, DPT [place] [address]: [phone]  Fax:  [phone] Name: CELNA RADOVICHMRN: [id number] of Birth: 919-Nov-1952

## 2020-02-06 ENCOUNTER — Ambulatory Visit (HOSPITAL_COMMUNITY): Payer: Medicare Other | Admitting: Physical Therapy

## 2020-02-10 ENCOUNTER — Encounter (HOSPITAL_COMMUNITY): Payer: BC Managed Care – PPO | Admitting: Physical Therapy

## 2020-03-05 ENCOUNTER — Telehealth: Payer: Self-pay | Admitting: Adult Health

## 2020-03-05 NOTE — Telephone Encounter (Signed)
Patient called wanting to know if she is cleared to begin driving again post her stroke from January. Pt contacted PCP office first but was advised to FU with neurologist for driving clearance

## 2020-03-09 NOTE — Telephone Encounter (Signed)
When I previously saw patient, she had minimal residual stroke deficits and per review of therapy notes, she has made excellent progress in regards to recovery.  No neurological concerns regarding patient returning to driving.  Would recommend gradual return to driving recommending the patient first drives with another licensed driver in an empty parking lot. If the patient does well, they can drive on a quiet street with the licensed driver. If the patient does well, they can drive on a busy street with a licensed driver.  If patient continues to do well, patient will be cleared to drive independently.  For the first month after resuming driving, it is recommend no nighttime, busy/heavy traffic roads or Interstate driving.

## 2020-03-09 NOTE — Telephone Encounter (Signed)
I called pt and relayed the message to pt via JM/NP recommendation.  She stated her pcp recommended the same thing (taking it slow).  She appreciated call back.

## 2020-05-20 ENCOUNTER — Ambulatory Visit (INDEPENDENT_AMBULATORY_CARE_PROVIDER_SITE_OTHER): Payer: Medicare Other | Admitting: Adult Health

## 2020-05-20 ENCOUNTER — Encounter: Payer: Self-pay | Admitting: Adult Health

## 2020-05-20 VITALS — BP 114/74 | HR 59 | Ht 65.0 in | Wt 229.0 lb

## 2020-05-20 DIAGNOSIS — M79622 Pain in left upper arm: Secondary | ICD-10-CM | POA: Diagnosis not present

## 2020-05-20 DIAGNOSIS — I10 Essential (primary) hypertension: Secondary | ICD-10-CM

## 2020-05-20 DIAGNOSIS — R29898 Other symptoms and signs involving the musculoskeletal system: Secondary | ICD-10-CM | POA: Diagnosis not present

## 2020-05-20 DIAGNOSIS — I639 Cerebral infarction, unspecified: Secondary | ICD-10-CM | POA: Diagnosis not present

## 2020-05-20 DIAGNOSIS — E785 Hyperlipidemia, unspecified: Secondary | ICD-10-CM

## 2020-05-20 NOTE — Progress Notes (Signed)
[facility] [address] [place]. [place] [address] [phone]       STROKE FOLLOW UP NOTE  [name] Date of Birth:  1951-04-17 Medical Record Number:  [id number]   Reason for Referral: stroke follow up    CHIEF COMPLAINT:  Chief Complaint  Patient presents with  . Follow-up    cva fu, rm 8, alone , pt states she is doing well, reports left arm soreness     HPI:   Today, 05/20/2020, [name] is being seen for follow up regarding R CR/SO stroke in 12/2019.  She has greatly improved from a stroke standpoint with residual mild left arm weakness and complaints of intermittent left arm pain which has been present since completing therapy.  Completed therapy on 02/03/2020 as she was able to complete HEP independently.  She does report continuation of home exercises but has continued to experience pain greater when laying down.  Denies new stroke/TIA symptoms. Continues on aspirin and atorvastatin for secondary stroke prevention. Did have lipid panel obtained last week by PCP which was satisfactory per patient (unable to view via epic). Blood pressure today 114/74. No further concerns at this time.    History provided for reference purposes only Initial visit 01/21/2020 JM: [name] is a 69 year old female who is being seen today for hospital follow-up.  Residual deficits of left hemiparesis with ongoing improvement and participation in outpatient PT/OT.  Completed 3 weeks of DAPT and continues on aspirin.  Continues on atorvastatin 40 mg daily without myalgias.  Blood pressure today 130/80. Monitors at home any typically 110-120/80s. Returns to PCP 3/3 for follow up for ongoing monitoring/management of HTN and HLD.  Denies new or worsening stroke/TIA symptoms.  No further concerns at this time.  Stroke admission 12/18/2019: [name] is a 69 y.o. female with history of GERD, arthritis  presented on 12/18/2019 with 3 day hx L sided weakness.  Evaluated  by stroke team and Dr. Roda Shutters with stroke work-up revealing right CR/SO infarct as evidenced on MRI secondary to small vessel disease.  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  2D echo unremarkable.  Recommended DAPT for 3 weeks and aspirin alone.  Hypertensive urgency upon admission stabilized and recommended long-term BP goal normotensive range.  LDL 118 initiated atorvastatin 40 mg daily.  No history of DM with A1c 6.1.  Other stroke risk factors include his age, social EtOH use and obesity.  Other active problems include arthritis and GERD.  Residual deficits mild left hemiparesis and discharged home with recommendation of outpatient PT/OT.     ROS:   14 system review of systems performed and negative with exception of weakness and pain  PMH:  Past Medical History:  Diagnosis Date  . Arthritis   . GERD (gastroesophageal reflux disease)     PSH:  Past Surgical History:  Procedure Laterality Date  . NO PAST SURGERIES    . TOTAL HIP ARTHROPLASTY Right 05/22/2013   Procedure: TOTAL HIP ARTHROPLASTY- right ;  Surgeon: Loreta Ave, MD;  Location: [facility] OR;  Service: Orthopedics;  Laterality: Right;    Social History:  Social History   Socioeconomic History  . Marital status: Single    Spouse name: Not on file  . Number of children: Not on file  . Years of education: Not on file  . Highest education level: Not on file  Occupational History  . Not on file  Tobacco Use  . Smoking  status: Never Smoker  . Smokeless tobacco: Never Used  Vaping Use  . Vaping Use: Never used  Substance and Sexual Activity  . Alcohol use: Not Currently    Comment: occ  . Drug use: No  . Sexual activity: Not on file  Other Topics Concern  . Not on file  Social History Narrative  . Not on file   Social Determinants of Health   Financial Resource Strain:   . Difficulty of Paying Living Expenses:   Food Insecurity:   . Worried About Programme researcher, broadcasting/film/video in the  Last Year:   . Barista in the Last Year:   Transportation Needs: No Transportation Needs  . Lack of Transportation (Medical): No  . Lack of Transportation (Non-Medical): No  Physical Activity:   . Days of Exercise per Week:   . Minutes of Exercise per Session:   Stress:   . Feeling of Stress :   Social Connections:   . Frequency of Communication with Friends and Family:   . Frequency of Social Gatherings with Friends and Family:   . Attends Religious Services:   . Active Member of Clubs or Organizations:   . Attends Banker Meetings:   Marland Kitchen Marital Status:   Intimate Partner Violence:   . Fear of Current or Ex-Partner:   . Emotionally Abused:   Marland Kitchen Physically Abused:   . Sexually Abused:     Family History: No family history on file.  Medications:   Current Outpatient Medications on File Prior to Visit  Medication Sig Dispense Refill  . aspirin EC 81 MG EC tablet Take 1 tablet (81 mg total) by mouth daily. 90 tablet 2  . atorvastatin (LIPITOR) 40 MG tablet Take 1 tablet (40 mg total) by mouth daily at 6 PM. 90 tablet 2  . benazepril-hydrochlorthiazide (LOTENSIN HCT) 20-12.5 MG tablet Take 1 tablet by mouth daily. 30 tablet 11  . Cholecalciferol (VITAMIN D-3) 25 MCG (1000 UT) CAPS Take 1,000 Units by mouth daily with breakfast.     . Cyanocobalamin (VITAMIN B-12 PO) Take 1 tablet by mouth daily with breakfast.     . Multiple Vitamins-Minerals (ONE-A-DAY WOMENS 50+ ADVANTAGE) TABS Take 1 tablet by mouth daily with breakfast.     No current facility-administered medications on file prior to visit.    Allergies:   Allergies  Allergen Reactions  . Glucosamine Anaphylaxis, Shortness Of Breath and Swelling  . Iodine Anaphylaxis, Shortness Of Breath and Swelling  . Shellfish-Derived Products Anaphylaxis, Shortness Of Breath and Swelling     Physical Exam  Vitals:   05/20/20 1237  BP: 114/74  Pulse: (!) 59  Weight: 229 lb (103.9 kg)  Height: 5\' 5"  (1.651  m)   Body mass index is 38.11 kg/m. No exam data present   General: Obese pleasant middle-age African-American female, seated, in no evident distress Head: head normocephalic and atraumatic.   Neck: supple with no carotid or supraclavicular bruits Cardiovascular: regular rate and rhythm, no murmurs Musculoskeletal: no deformity Skin:  no rash/petichiae Vascular:  Normal pulses all extremities   Neurologic Exam Mental Status: Awake and fully alert. normal speech and language. Oriented to place and time. Recent and remote memory intact. Attention span, concentration and fund of knowledge appropriate. Mood and affect appropriate.  Cranial Nerves: Pupils equal, briskly reactive to light. Extraocular movements full without nystagmus. Visual fields full to confrontation. Hearing intact. Facial sensation intact. Face, tongue, palate moves normally and symmetrically.  Motor: Normal bulk and  tone.  LUE: 4/5 tricep; 5/5 deltoid, bicep and grip strength; full ROM shoulder without increased pain LLE: 5/5 Full strength right upper and lower extremity Sensory.: intact to touch , pinprick , position and vibratory sensation.  Coordination: Rapid alternating movements normal in all extremities. Finger-to-nose and heel-to-shin performed accurately bilaterally. Gait and Station: Arises from chair without difficulty. Stance is normal. Gait demonstrates  broad-based gait without evidence of imbalance.   Reflexes: 1+ and symmetric. Toes downgoing.       ASSESSMENT: [name] is a 69 y.o. year old female presented with 3-day history of left-sided weakness on 12/18/2019 with stroke work-up revealing R CR/SO infarct secondary to small vessel disease source. Vascular risk factors include HTN and HLD.  Residual deficits of LUE weakness and LUE pain    PLAN:  1. R CR/SO stroke:  -LUE weakness, poststroke: Likely cause of LUE pain which has been present since completing therapy in March.  Referral  placed for OT reevaluation for further strengthening exercises and benefit in regards to pain -Continue aspirin 81 mg daily  and atorvastatin 40 mg daily for secondary stroke prevention.  -Maintain strict control of hypertension with blood pressure goal below 130/90, diabetes with hemoglobin A1c goal below 6.5% and cholesterol with LDL cholesterol (bad cholesterol) goal below 70 mg/dL.  I also advised the patient to eat a healthy diet with plenty of whole grains, cereals, fruits and vegetables, exercise regularly with at least 30 minutes of continuous activity daily and maintain ideal body weight. 2. HTN: Stable.  Continue to follow with PCP for monitoring management 3. HLD: Per patient report stable.  Continue to follow with PCP for prescribing of atorvastatin and ongoing monitoring management   Follow up in 6 months or call earlier if needed  CC: Burdine, Virgina Evener, MD Dr. Leonie Man ([place] provider)  I spent 25 minutes of face-to-face and non-face-to-face time with patient.  This included previsit chart review, lab review, study review, order entry, electronic health record documentation, patient education   Frann Rider, [facility]  [facility] [address] [place], [place] [address]  Phone [phone] Fax [phone] Note: This document was prepared with digital dictation and possible smart phrase technology. Any transcriptional errors that result from this process are unintentional.

## 2020-05-20 NOTE — Patient Instructions (Signed)
Continue aspirin 81 mg daily  and atorvastatin for secondary stroke prevention  Continue to follow up with PCP regarding cholesterol and blood pressure management   Continue to monitor blood pressure at home  Maintain strict control of hypertension with blood pressure goal below 130/90, diabetes with hemoglobin A1c goal below 6.5% and cholesterol with LDL cholesterol (bad cholesterol) goal below 70 mg/dL. I also advised the patient to eat a healthy diet with plenty of whole grains, cereals, fruits and vegetables, exercise regularly and maintain ideal body weight.  Followup in the future with me in 6 months or call earlier if needed       Thank you for coming to see [place] at [facility]. I hope we have been able to provide you high quality care today.  You may receive a patient satisfaction survey over the next few weeks. We would appreciate your feedback and comments so that we may continue to improve ourselves and the health of our patients.

## 2020-05-22 NOTE — Progress Notes (Signed)
I agree with the above plan 

## 2020-11-11 ENCOUNTER — Ambulatory Visit (INDEPENDENT_AMBULATORY_CARE_PROVIDER_SITE_OTHER): Payer: Medicare Other | Admitting: Adult Health

## 2020-11-11 ENCOUNTER — Encounter: Payer: Self-pay | Admitting: Adult Health

## 2020-11-11 VITALS — BP 122/80 | HR 60 | Ht 65.0 in | Wt 241.0 lb

## 2020-11-11 DIAGNOSIS — E785 Hyperlipidemia, unspecified: Secondary | ICD-10-CM | POA: Diagnosis not present

## 2020-11-11 DIAGNOSIS — I639 Cerebral infarction, unspecified: Secondary | ICD-10-CM

## 2020-11-11 DIAGNOSIS — I10 Essential (primary) hypertension: Secondary | ICD-10-CM | POA: Diagnosis not present

## 2020-11-11 NOTE — Patient Instructions (Signed)
Continue to do exercises at home with hopeful ongoing recovery  Continue aspirin 81 mg daily  and atorvastatin  for secondary stroke prevention  Continue to follow up with PCP regarding cholesterol and blood pressure management  Maintain strict control of hypertension with blood pressure goal below 130/90 and cholesterol with LDL cholesterol (bad cholesterol) goal below 70 mg/dL.          Thank you for coming to see [place] at [facility]. I hope we have been able to provide you high quality care today.  You may receive a patient satisfaction survey over the next few weeks. We would appreciate your feedback and comments so that we may continue to improve ourselves and the health of our patients.

## 2020-11-11 NOTE — Progress Notes (Signed)
[facility] [address] [place]. [place] [address] [phone]       STROKE FOLLOW UP NOTE  [name] Date of Birth:  Jul 29, 1951 Medical Record Number:  [id number]   Reason for Referral: stroke follow up    CHIEF COMPLAINT:  Chief Complaint  Patient presents with  . Follow-up    TX rm  . Cerebrovascular Accident    pt said her left arm weakness is better    HPI:   Today, 11/11/2020, [name] returns for 20-month stroke follow-up.  Reports residual very slight left arm weakness. Pain has greatly improved and does exercises daily.  Denies new stroke/TIA symptoms.  On aspirin and atorvastatin for secondary stroke prevention without side effects. Recently had lab work by PCP - unable to view via epic. Blood pressure today 122/80. Monitors every other day at home which has been stable.  No concerns at this time.   History provided for reference purposes only Update 05/20/2020 JM:  [name] is being seen for follow up regarding R CR/SO stroke in 12/2019.  She has greatly improved from a stroke standpoint with residual mild left arm weakness and complaints of intermittent left arm pain which has been present since completing therapy.  Completed therapy on 02/03/2020 as she was able to complete HEP independently.  She does report continuation of home exercises but has continued to experience pain greater when laying down.  Denies new stroke/TIA symptoms. Continues on aspirin and atorvastatin for secondary stroke prevention. Did have lipid panel obtained last week by PCP which was satisfactory per patient (unable to view via epic). Blood pressure today 114/74. No further concerns at this time.   Initial visit 01/21/2020 JM: [name] is a 69 year old female who is being seen today for hospital follow-up.  Residual deficits of left hemiparesis with ongoing improvement and participation in outpatient PT/OT.  Completed 3 weeks of DAPT and continues on aspirin.   Continues on atorvastatin 40 mg daily without myalgias.  Blood pressure today 130/80. Monitors at home any typically 110-120/80s. Returns to PCP 3/3 for follow up for ongoing monitoring/management of HTN and HLD.  Denies new or worsening stroke/TIA symptoms.  No further concerns at this time.  Stroke admission 12/18/2019: [name] is a 69 y.o. female with history of GERD, arthritis  presented on 12/18/2019 with 3 day hx L sided weakness.  Evaluated by stroke team and Dr. Roda Shutters with stroke work-up revealing right CR/SO infarct as evidenced on MRI secondary to small vessel disease.  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  2D echo unremarkable.  Recommended DAPT for 3 weeks and aspirin alone.  Hypertensive urgency upon admission stabilized and recommended long-term BP goal normotensive range.  LDL 118 initiated atorvastatin 40 mg daily.  No history of DM with A1c 6.1.  Other stroke risk factors include his age, social EtOH use and obesity.  Other active problems include arthritis and GERD.  Residual deficits mild left hemiparesis and discharged home with recommendation of outpatient PT/OT.     ROS:   14 system review of systems performed and negative with exception of weakness   PMH:  Past Medical History:  Diagnosis Date  . Arthritis   . GERD (gastroesophageal reflux disease)     PSH:  Past Surgical History:  Procedure Laterality Date  . NO PAST SURGERIES    . TOTAL HIP ARTHROPLASTY Right 05/22/2013   Procedure: TOTAL HIP ARTHROPLASTY- right ;  Surgeon: Reuel Boom  Georg Ruddle, MD;  Location: [facility] OR;  Service: Orthopedics;  Laterality: Right;    Social History:  Social History   Socioeconomic History  . Marital status: Single    Spouse name: Not on file  . Number of children: Not on file  . Years of education: Not on file  . Highest education level: Not on file  Occupational History  . Not on file  Tobacco Use  . Smoking status: Never Smoker  .  Smokeless tobacco: Never Used  Vaping Use  . Vaping Use: Never used  Substance and Sexual Activity  . Alcohol use: Not Currently    Comment: occ  . Drug use: No  . Sexual activity: Not on file  Other Topics Concern  . Not on file  Social History Narrative  . Not on file   Social Determinants of Health   Financial Resource Strain:   . Difficulty of Paying Living Expenses: Not on file  Food Insecurity:   . Worried About Programme researcher, broadcasting/film/video in the Last Year: Not on file  . Ran Out of Food in the Last Year: Not on file  Transportation Needs: No Transportation Needs  . Lack of Transportation (Medical): No  . Lack of Transportation (Non-Medical): No  Physical Activity:   . Days of Exercise per Week: Not on file  . Minutes of Exercise per Session: Not on file  Stress:   . Feeling of Stress : Not on file  Social Connections:   . Frequency of Communication with Friends and Family: Not on file  . Frequency of Social Gatherings with Friends and Family: Not on file  . Attends Religious Services: Not on file  . Active Member of Clubs or Organizations: Not on file  . Attends Banker Meetings: Not on file  . Marital Status: Not on file  Intimate Partner Violence:   . Fear of Current or Ex-Partner: Not on file  . Emotionally Abused: Not on file  . Physically Abused: Not on file  . Sexually Abused: Not on file    Family History: No family history on file.  Medications:   Current Outpatient Medications on File Prior to Visit  Medication Sig Dispense Refill  . aspirin EC 81 MG EC tablet Take 1 tablet (81 mg total) by mouth daily. 90 tablet 2  . atorvastatin (LIPITOR) 40 MG tablet Take 1 tablet (40 mg total) by mouth daily at 6 PM. 90 tablet 2  . benazepril-hydrochlorthiazide (LOTENSIN HCT) 20-12.5 MG tablet Take 1 tablet by mouth daily. 30 tablet 11  . Cholecalciferol (VITAMIN D-3) 25 MCG (1000 UT) CAPS Take 1,000 Units by mouth daily with breakfast.     . Cyanocobalamin  (VITAMIN B-12 PO) Take 1 tablet by mouth daily with breakfast.     . Multiple Vitamins-Minerals (ONE-A-DAY WOMENS 50+ ADVANTAGE) TABS Take 1 tablet by mouth daily with breakfast.     No current facility-administered medications on file prior to visit.    Allergies:   Allergies  Allergen Reactions  . Glucosamine Anaphylaxis, Shortness Of Breath and Swelling  . Iodine Anaphylaxis, Shortness Of Breath and Swelling  . Shellfish-Derived Products Anaphylaxis, Shortness Of Breath and Swelling     Physical Exam  Vitals:   11/11/20 0917  BP: 122/80  Pulse: 60  Weight: 241 lb (109.3 kg)  Height: 5\' 5"  (1.651 m)   Body mass index is 40.1 kg/m. No exam data present   General: Obese pleasant middle-age African-American female, seated, in no evident distress  Head: head normocephalic and atraumatic.   Neck: supple with no carotid or supraclavicular bruits Cardiovascular: regular rate and rhythm, no murmurs Musculoskeletal: no deformity Skin:  no rash/petichiae Vascular:  Normal pulses all extremities   Neurologic Exam Mental Status: Awake and fully alert.   Fluent speech and language. Oriented to place and time. Recent and remote memory intact. Attention span, concentration and fund of knowledge appropriate. Mood and affect appropriate.  Cranial Nerves: Pupils equal, briskly reactive to light. Extraocular movements full without nystagmus. Visual fields full to confrontation. Hearing intact. Facial sensation intact. Face, tongue, palate moves normally and symmetrically.  Motor: Normal bulk and tone. Full strength in all tested extremities except very mild left hand weakness and decreased dexterity Sensory.: intact to touch , pinprick , position and vibratory sensation.  Coordination: Rapid alternating movements normal in all extremities except slightly decreased left hand. Finger-to-nose and heel-to-shin performed accurately bilaterally.  Gait and Station: Arises from chair without  difficulty. Stance is normal. Gait demonstrates  broad-based gait with normal stride length without evidence of imbalance or assistive device Reflexes: 1+ and symmetric. Toes downgoing.       ASSESSMENT: [name] is a 69 y.o. year old female presented with 3-day history of left-sided weakness on 12/18/2019 with stroke work-up revealing R CR/SO infarct secondary to small vessel disease source. Vascular risk factors include HTN and HLD.      PLAN:  1. R CR/SO stroke: a. residual deficit: Decreased left hand dexterity and grip strength.  Continue exercises at home as these have been beneficial for improvement of weakness and avoidance of pain b. Continue aspirin 81 mg daily  and atorvastatin 40 mg daily for secondary stroke prevention. c. discussed secondary stroke prevention measures and importance of close PCP f/u for aggressive stroke risk factor management  2. HTN: BP goal >130/90.  Stable on benazepril-hydrochlorthiazide per PCP 3. HLD: LDL goal>70. On atorvastatin 40mg  daily per PCP. Per pt, recent lipid panel by PCP (unable to view via epic) - f/u Friday and plans on getting results   Overall stable from a stroke standpoint and recommend follow-up on an as-needed basis   CC: Burdine, Wednesday, MD Dr. Ananias Pilgrim ([facility] provider)  I spent 30 minutes of face-to-face and non-face-to-face time with patient.  This included previsit chart review, lab review, study review, order entry, electronic health record documentation, patient education and discussion regarding history of prior stroke, residual deficits, importance of managing stroke risk factors, and answered all other questions to patient satisfaction   Pearlean Brownie, [facility]  [facility] [address] [place], [place]  Phone [phone] Fax [phone] Note: This document was prepared with digital dictation and possible smart phrase technology. Any transcriptional errors that  result from this process are unintentional.

## 2020-11-11 NOTE — Progress Notes (Signed)
I agree with the above plan 

## 2021-01-10 IMAGING — MR MR HEAD W/O CM
12 of 13 series · 44 of 48 positions shown · non-contrast
Comparison: CT head 12/18/2019

CLINICAL DATA: Stroke.  Left-sided weakness.

EXAM:
MRI HEAD WITHOUT CONTRAST
MRA HEAD WITHOUT CONTRAST
TECHNIQUE: Multiplanar, multiecho pulse sequences of the brain and surrounding
structures were obtained without intravenous contrast. Angiographic
images of the head were obtained using MRA technique without
contrast.

[Series 5: DWI · axial · 3.0mm · 0.88mm/px · z∈[-122,+19]mm · 8 of 96 slices shown (1 of 4)]
[im 1/96]
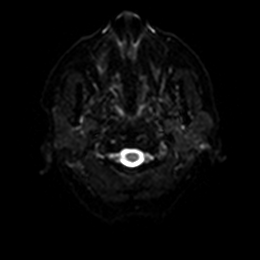
[im 14/96]
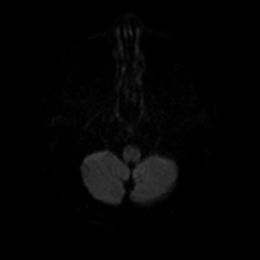
[im 28/96]
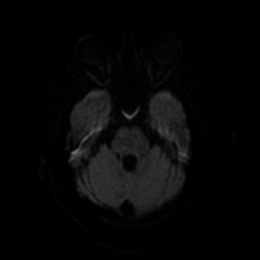
[im 41/96]
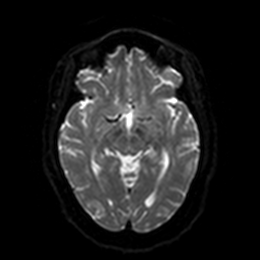
[im 55/96]
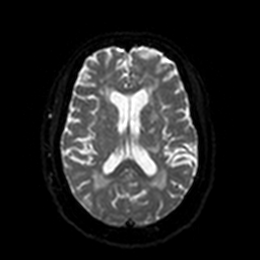
[im 68/96]
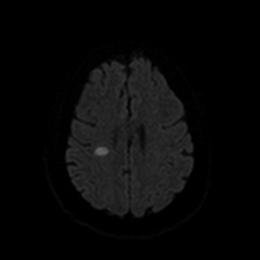
[im 82/96]
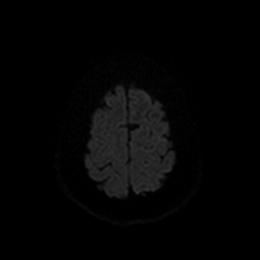
[im 96/96]
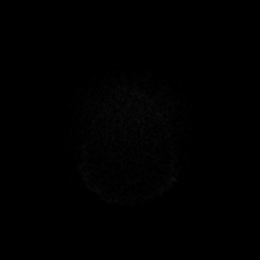

[Series 6: DWI · axial · 3.0mm · 0.88mm/px · z∈[-122,+19]mm · 4 of 48 slices shown (2 of 4)]
[im 1/48]
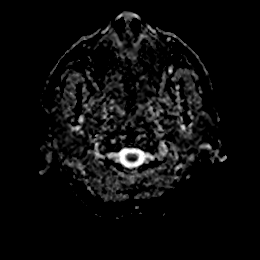
[im 16/48]
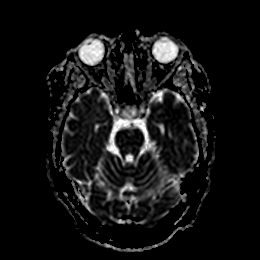
[im 32/48]
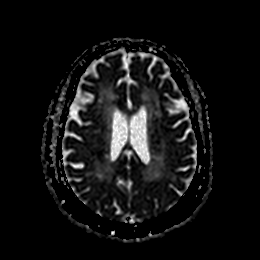
[im 48/48]
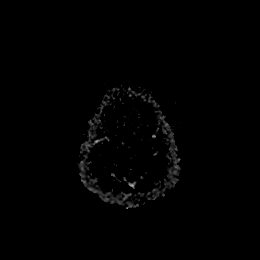

[Series 7: DWI · coronal · 4.0mm · 0.88mm/px · 5 of 64 slices shown (3 of 4)]
[im 1/64]
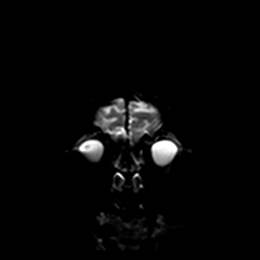
[im 16/64]
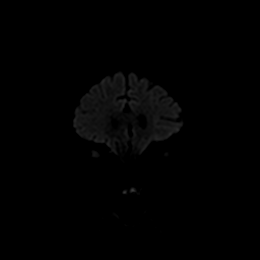
[im 32/64]
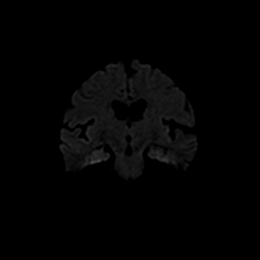
[im 48/64]
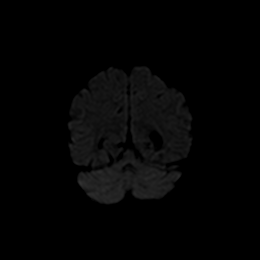
[im 64/64]
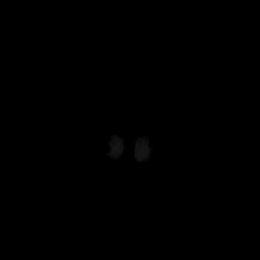

[Series 8: DWI · coronal · 4.0mm · 0.88mm/px · 3 of 32 slices shown (4 of 4)]
[im 1/32]
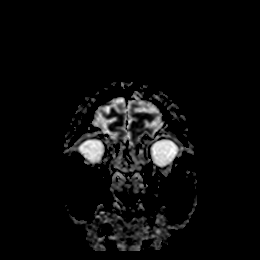
[im 16/32]
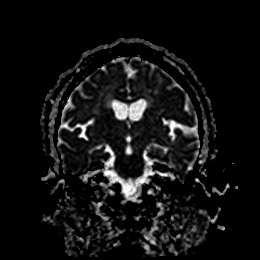
[im 32/32]
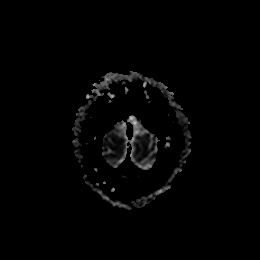

[Series 9: T1 · sagittal · 5.0mm · 0.75mm/px · 2 of 23 slices shown]
[im 1/23]
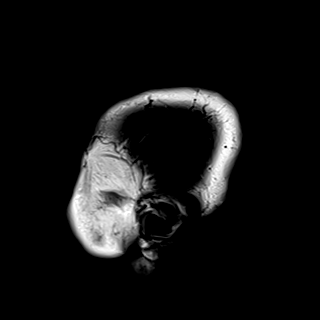
[im 23/23]
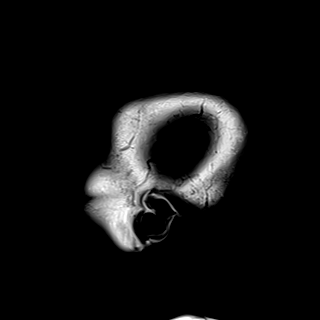

[Series 10: T2 · axial · 5.0mm · 0.72mm/px · z∈[-121,+23]mm · 2 of 25 slices shown (1 of 2)]
[im 1/25]
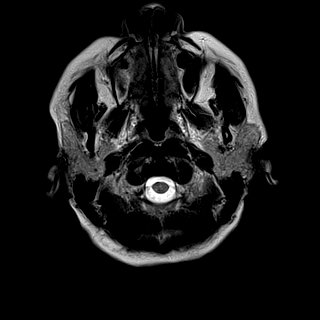
[im 25/25]
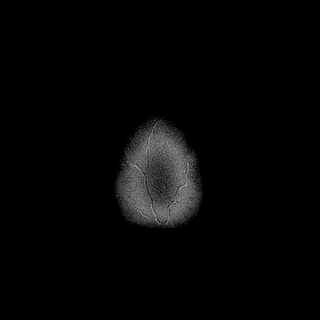

[Series 11: FLAIR · axial · 5.0mm · 0.45mm/px · z∈[-121,+23]mm · 2 of 25 slices shown]
[im 1/25]
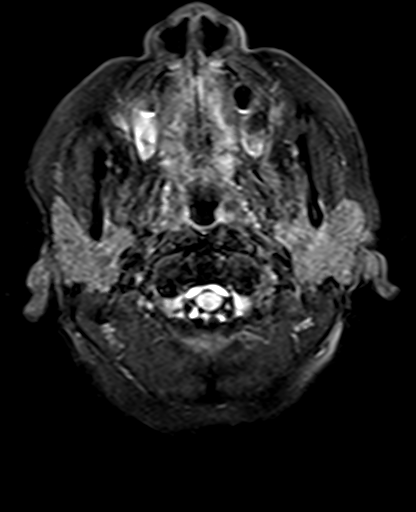
[im 25/25]
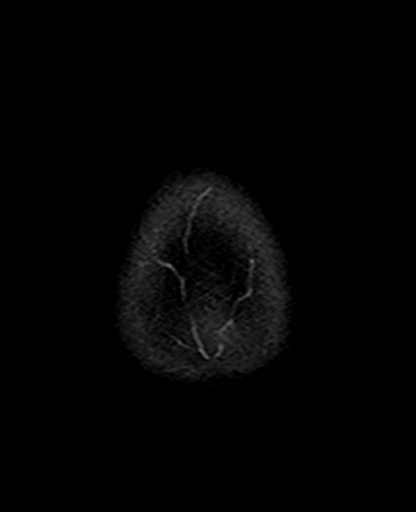

[Series 12: mag_images · axial · 3.0mm · 0.90mm/px · z∈[-129,+24]mm · 4 of 52 slices shown]
[im 1/52]
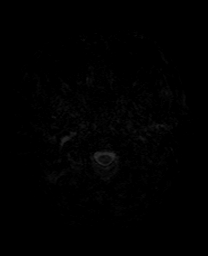
[im 18/52]
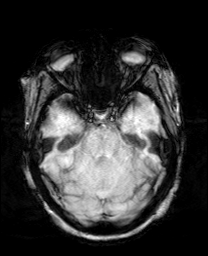
[im 35/52]
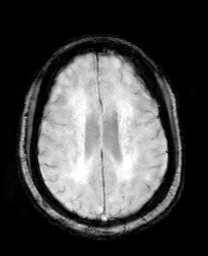
[im 52/52]
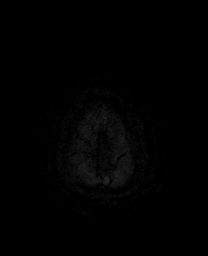

[Series 13: pha_images · axial · 3.0mm · 0.90mm/px · z∈[-129,+24]mm · 4 of 52 slices shown]
[im 1/52]
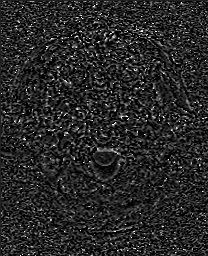
[im 18/52]
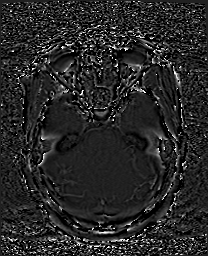
[im 35/52]
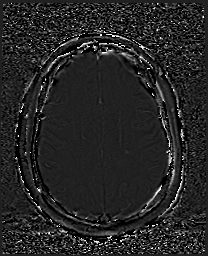
[im 52/52]
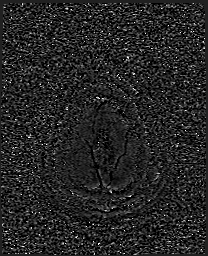

[Series 14: swi_images · axial · 3.0mm · 0.90mm/px · z∈[-129,+24]mm · 4 of 52 slices shown]
[im 1/52]
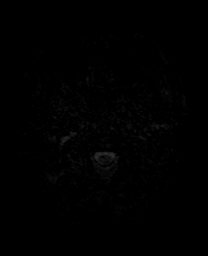
[im 18/52]
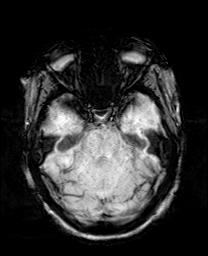
[im 35/52]
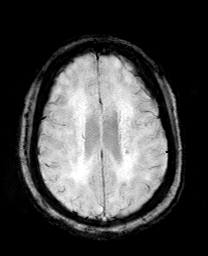
[im 52/52]
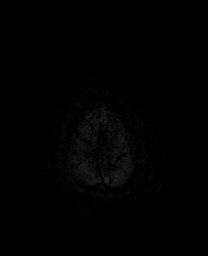

[Series 15: mip_images(sw) · axial · 24.0mm · 0.90mm/px · z∈[-119,+13]mm · 4 of 45 slices shown]
[im 1/45]
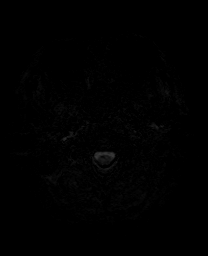
[im 15/45]
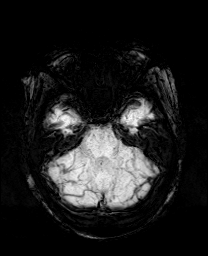
[im 30/45]
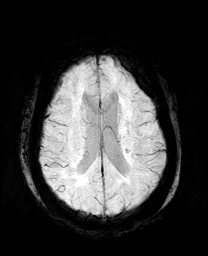
[im 45/45]
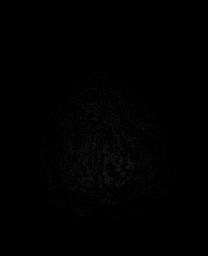

[Series 17: T2 · coronal · 5.0mm · 0.34mm/px · 2 of 29 slices shown (2 of 2)]
[im 1/29]
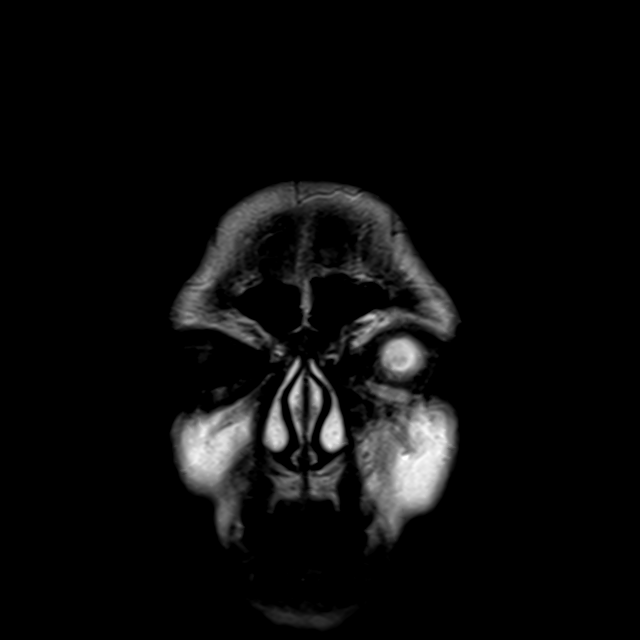
[im 29/29]
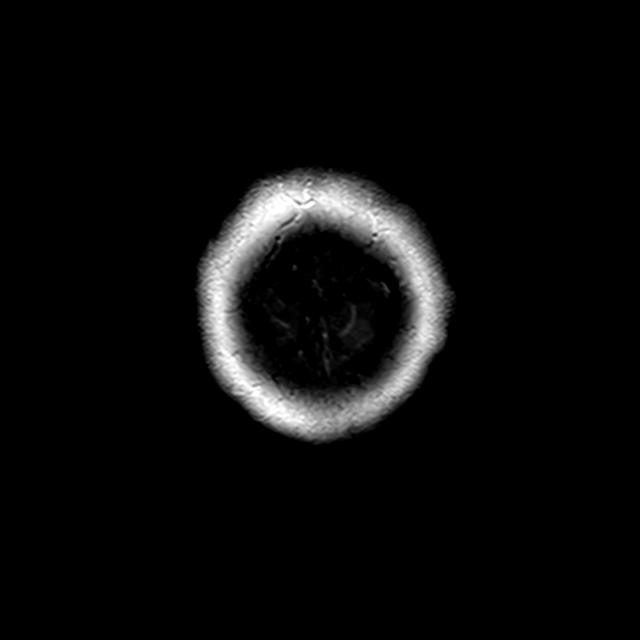

[44 of 48 positions shown; findings below may reference images not displayed]

FINDINGS: MRI HEAD FINDINGS

Brain: Acute infarct right parietal deep white matter measuring
approximately 10 x 15 mm. No other acute infarct.

Moderate chronic microvascular ischemic changes throughout the white
matter. Mild chronic ischemia in the pons and thalamus bilaterally.
Negative for hemorrhage or mass. Ventricle size normal.

Vascular: Normal arterial flow voids

Skull and upper cervical spine: No focal skeletal lesion.

Sinuses/Orbits: Negative

Other: None

MRA HEAD FINDINGS

Right vertebral dominant and widely patent. Non dominant left
vertebral artery with moderately severe stenosis distal to left
PICA. Left PICA patent. Small right PICA patent. AICA and superior
cerebellar arteries patent bilaterally. Fetal origin right posterior
cerebral artery. Mild stenosis distal left posterior cerebral
artery.

Internal carotid artery patent bilaterally without stenosis or
aneurysm. Anterior and middle cerebral arteries patent bilaterally.
IMPRESSION: 1. Acute infarct in the right parietal deep white matter.
2. Moderate chronic microvascular ischemic change
3. Moderate stenosis distal left vertebral artery. No intracranial
large vessel occlusion.

## 2021-03-25 DIAGNOSIS — Z1231 Encounter for screening mammogram for malignant neoplasm of breast: Secondary | ICD-10-CM | POA: Diagnosis not present

## 2021-03-30 DIAGNOSIS — Z23 Encounter for immunization: Secondary | ICD-10-CM | POA: Diagnosis not present

## 2021-05-08 DIAGNOSIS — E559 Vitamin D deficiency, unspecified: Secondary | ICD-10-CM | POA: Diagnosis not present

## 2021-05-08 DIAGNOSIS — Z13220 Encounter for screening for lipoid disorders: Secondary | ICD-10-CM | POA: Diagnosis not present

## 2021-05-08 DIAGNOSIS — R7303 Prediabetes: Secondary | ICD-10-CM | POA: Diagnosis not present

## 2021-05-08 DIAGNOSIS — I10 Essential (primary) hypertension: Secondary | ICD-10-CM | POA: Diagnosis not present

## 2021-05-10 DIAGNOSIS — Z6839 Body mass index (BMI) 39.0-39.9, adult: Secondary | ICD-10-CM | POA: Diagnosis not present

## 2021-05-10 DIAGNOSIS — R7303 Prediabetes: Secondary | ICD-10-CM | POA: Diagnosis not present

## 2021-05-10 DIAGNOSIS — S80862A Insect bite (nonvenomous), left lower leg, initial encounter: Secondary | ICD-10-CM | POA: Diagnosis not present

## 2021-05-10 DIAGNOSIS — I69354 Hemiplegia and hemiparesis following cerebral infarction affecting left non-dominant side: Secondary | ICD-10-CM | POA: Diagnosis not present

## 2021-05-10 DIAGNOSIS — I10 Essential (primary) hypertension: Secondary | ICD-10-CM | POA: Diagnosis not present

## 2021-05-10 DIAGNOSIS — Z833 Family history of diabetes mellitus: Secondary | ICD-10-CM | POA: Diagnosis not present

## 2021-06-09 DIAGNOSIS — Z23 Encounter for immunization: Secondary | ICD-10-CM | POA: Diagnosis not present

## 2021-08-31 DIAGNOSIS — Z23 Encounter for immunization: Secondary | ICD-10-CM | POA: Diagnosis not present

## 2021-08-31 DIAGNOSIS — U071 COVID-19: Secondary | ICD-10-CM | POA: Diagnosis not present

## 2021-11-11 DIAGNOSIS — I10 Essential (primary) hypertension: Secondary | ICD-10-CM | POA: Diagnosis not present

## 2021-11-11 DIAGNOSIS — Z13220 Encounter for screening for lipoid disorders: Secondary | ICD-10-CM | POA: Diagnosis not present

## 2021-11-11 DIAGNOSIS — Z833 Family history of diabetes mellitus: Secondary | ICD-10-CM | POA: Diagnosis not present

## 2021-11-11 DIAGNOSIS — R7303 Prediabetes: Secondary | ICD-10-CM | POA: Diagnosis not present

## 2021-11-16 DIAGNOSIS — Z1331 Encounter for screening for depression: Secondary | ICD-10-CM | POA: Diagnosis not present

## 2021-11-16 DIAGNOSIS — Z0001 Encounter for general adult medical examination with abnormal findings: Secondary | ICD-10-CM | POA: Diagnosis not present

## 2021-11-16 DIAGNOSIS — Z1389 Encounter for screening for other disorder: Secondary | ICD-10-CM | POA: Diagnosis not present

## 2021-11-16 DIAGNOSIS — I10 Essential (primary) hypertension: Secondary | ICD-10-CM | POA: Diagnosis not present

## 2021-11-16 DIAGNOSIS — I69354 Hemiplegia and hemiparesis following cerebral infarction affecting left non-dominant side: Secondary | ICD-10-CM | POA: Diagnosis not present

## 2021-11-16 DIAGNOSIS — M1611 Unilateral primary osteoarthritis, right hip: Secondary | ICD-10-CM | POA: Diagnosis not present

## 2021-11-16 DIAGNOSIS — Z23 Encounter for immunization: Secondary | ICD-10-CM | POA: Diagnosis not present

## 2021-11-16 DIAGNOSIS — R7303 Prediabetes: Secondary | ICD-10-CM | POA: Diagnosis not present

## 2022-11-15 DIAGNOSIS — R7303 Prediabetes: Secondary | ICD-10-CM | POA: Diagnosis not present

## 2022-11-15 DIAGNOSIS — I10 Essential (primary) hypertension: Secondary | ICD-10-CM | POA: Diagnosis not present

## 2022-11-15 DIAGNOSIS — Z13220 Encounter for screening for lipoid disorders: Secondary | ICD-10-CM | POA: Diagnosis not present

## 2022-11-25 DIAGNOSIS — R710 Precipitous drop in hematocrit: Secondary | ICD-10-CM | POA: Diagnosis not present

## 2022-11-25 DIAGNOSIS — I69354 Hemiplegia and hemiparesis following cerebral infarction affecting left non-dominant side: Secondary | ICD-10-CM | POA: Diagnosis not present

## 2022-11-25 DIAGNOSIS — R7303 Prediabetes: Secondary | ICD-10-CM | POA: Diagnosis not present

## 2022-11-25 DIAGNOSIS — Z6832 Body mass index (BMI) 32.0-32.9, adult: Secondary | ICD-10-CM | POA: Diagnosis not present

## 2022-11-25 DIAGNOSIS — R944 Abnormal results of kidney function studies: Secondary | ICD-10-CM | POA: Diagnosis not present

## 2022-11-25 DIAGNOSIS — Z23 Encounter for immunization: Secondary | ICD-10-CM | POA: Diagnosis not present

## 2022-11-25 DIAGNOSIS — I10 Essential (primary) hypertension: Secondary | ICD-10-CM | POA: Diagnosis not present

## 2023-01-19 DIAGNOSIS — R710 Precipitous drop in hematocrit: Secondary | ICD-10-CM | POA: Diagnosis not present

## 2023-01-19 DIAGNOSIS — R944 Abnormal results of kidney function studies: Secondary | ICD-10-CM | POA: Diagnosis not present

## 2023-03-21 DIAGNOSIS — R944 Abnormal results of kidney function studies: Secondary | ICD-10-CM | POA: Diagnosis not present

## 2023-05-25 DIAGNOSIS — E559 Vitamin D deficiency, unspecified: Secondary | ICD-10-CM | POA: Diagnosis not present

## 2023-05-25 DIAGNOSIS — Z1329 Encounter for screening for other suspected endocrine disorder: Secondary | ICD-10-CM | POA: Diagnosis not present

## 2023-05-25 DIAGNOSIS — I10 Essential (primary) hypertension: Secondary | ICD-10-CM | POA: Diagnosis not present

## 2023-05-25 DIAGNOSIS — R7303 Prediabetes: Secondary | ICD-10-CM | POA: Diagnosis not present

## 2023-05-25 DIAGNOSIS — Z13220 Encounter for screening for lipoid disorders: Secondary | ICD-10-CM | POA: Diagnosis not present

## 2023-06-01 DIAGNOSIS — N1832 Chronic kidney disease, stage 3b: Secondary | ICD-10-CM | POA: Diagnosis not present

## 2023-06-01 DIAGNOSIS — Z6829 Body mass index (BMI) 29.0-29.9, adult: Secondary | ICD-10-CM | POA: Diagnosis not present

## 2023-06-01 DIAGNOSIS — I69354 Hemiplegia and hemiparesis following cerebral infarction affecting left non-dominant side: Secondary | ICD-10-CM | POA: Diagnosis not present

## 2023-06-01 DIAGNOSIS — R7303 Prediabetes: Secondary | ICD-10-CM | POA: Diagnosis not present

## 2023-06-01 DIAGNOSIS — Z0001 Encounter for general adult medical examination with abnormal findings: Secondary | ICD-10-CM | POA: Diagnosis not present

## 2023-06-01 DIAGNOSIS — Z1211 Encounter for screening for malignant neoplasm of colon: Secondary | ICD-10-CM | POA: Diagnosis not present

## 2023-06-01 DIAGNOSIS — D649 Anemia, unspecified: Secondary | ICD-10-CM | POA: Diagnosis not present

## 2023-06-01 DIAGNOSIS — I10 Essential (primary) hypertension: Secondary | ICD-10-CM | POA: Diagnosis not present

## 2023-06-06 DIAGNOSIS — Z1231 Encounter for screening mammogram for malignant neoplasm of breast: Secondary | ICD-10-CM | POA: Diagnosis not present

## 2023-07-13 DIAGNOSIS — N1832 Chronic kidney disease, stage 3b: Secondary | ICD-10-CM | POA: Diagnosis not present

## 2023-07-13 DIAGNOSIS — R944 Abnormal results of kidney function studies: Secondary | ICD-10-CM | POA: Diagnosis not present

## 2023-08-22 DIAGNOSIS — Z1211 Encounter for screening for malignant neoplasm of colon: Secondary | ICD-10-CM | POA: Diagnosis not present

## 2023-08-22 DIAGNOSIS — Z1212 Encounter for screening for malignant neoplasm of rectum: Secondary | ICD-10-CM | POA: Diagnosis not present

## 2023-08-28 DIAGNOSIS — R944 Abnormal results of kidney function studies: Secondary | ICD-10-CM | POA: Diagnosis not present

## 2023-08-28 DIAGNOSIS — N1832 Chronic kidney disease, stage 3b: Secondary | ICD-10-CM | POA: Diagnosis not present

## 2023-08-28 DIAGNOSIS — I10 Essential (primary) hypertension: Secondary | ICD-10-CM | POA: Diagnosis not present

## 2023-08-28 DIAGNOSIS — R7303 Prediabetes: Secondary | ICD-10-CM | POA: Diagnosis not present

## 2023-09-06 DIAGNOSIS — I10 Essential (primary) hypertension: Secondary | ICD-10-CM | POA: Diagnosis not present

## 2023-09-06 DIAGNOSIS — Z6831 Body mass index (BMI) 31.0-31.9, adult: Secondary | ICD-10-CM | POA: Diagnosis not present

## 2023-09-06 DIAGNOSIS — D649 Anemia, unspecified: Secondary | ICD-10-CM | POA: Diagnosis not present

## 2023-09-06 DIAGNOSIS — I69354 Hemiplegia and hemiparesis following cerebral infarction affecting left non-dominant side: Secondary | ICD-10-CM | POA: Diagnosis not present

## 2023-09-06 DIAGNOSIS — N1831 Chronic kidney disease, stage 3a: Secondary | ICD-10-CM | POA: Diagnosis not present

## 2023-09-06 DIAGNOSIS — R7303 Prediabetes: Secondary | ICD-10-CM | POA: Diagnosis not present

## 2024-01-30 DIAGNOSIS — N1832 Chronic kidney disease, stage 3b: Secondary | ICD-10-CM | POA: Diagnosis not present

## 2024-01-30 DIAGNOSIS — R7303 Prediabetes: Secondary | ICD-10-CM | POA: Diagnosis not present

## 2024-01-30 DIAGNOSIS — Z13220 Encounter for screening for lipoid disorders: Secondary | ICD-10-CM | POA: Diagnosis not present

## 2024-02-06 DIAGNOSIS — R7303 Prediabetes: Secondary | ICD-10-CM | POA: Diagnosis not present

## 2024-02-06 DIAGNOSIS — N1831 Chronic kidney disease, stage 3a: Secondary | ICD-10-CM | POA: Diagnosis not present

## 2024-02-06 DIAGNOSIS — Z6835 Body mass index (BMI) 35.0-35.9, adult: Secondary | ICD-10-CM | POA: Diagnosis not present

## 2024-02-06 DIAGNOSIS — I69354 Hemiplegia and hemiparesis following cerebral infarction affecting left non-dominant side: Secondary | ICD-10-CM | POA: Diagnosis not present

## 2024-02-06 DIAGNOSIS — I10 Essential (primary) hypertension: Secondary | ICD-10-CM | POA: Diagnosis not present

## 2024-05-16 DIAGNOSIS — N1832 Chronic kidney disease, stage 3b: Secondary | ICD-10-CM | POA: Diagnosis not present

## 2024-05-16 DIAGNOSIS — D649 Anemia, unspecified: Secondary | ICD-10-CM | POA: Diagnosis not present

## 2024-05-16 DIAGNOSIS — R7303 Prediabetes: Secondary | ICD-10-CM | POA: Diagnosis not present

## 2024-05-16 DIAGNOSIS — I10 Essential (primary) hypertension: Secondary | ICD-10-CM | POA: Diagnosis not present

## 2024-05-23 DIAGNOSIS — I10 Essential (primary) hypertension: Secondary | ICD-10-CM | POA: Diagnosis not present

## 2024-05-23 DIAGNOSIS — R7303 Prediabetes: Secondary | ICD-10-CM | POA: Diagnosis not present

## 2024-05-23 DIAGNOSIS — N1831 Chronic kidney disease, stage 3a: Secondary | ICD-10-CM | POA: Diagnosis not present

## 2024-05-23 DIAGNOSIS — Z6835 Body mass index (BMI) 35.0-35.9, adult: Secondary | ICD-10-CM | POA: Diagnosis not present

## 2024-05-23 DIAGNOSIS — E669 Obesity, unspecified: Secondary | ICD-10-CM | POA: Diagnosis not present

## 2024-07-08 DIAGNOSIS — Z1231 Encounter for screening mammogram for malignant neoplasm of breast: Secondary | ICD-10-CM | POA: Diagnosis not present

## 2024-07-23 DIAGNOSIS — M8588 Other specified disorders of bone density and structure, other site: Secondary | ICD-10-CM | POA: Diagnosis not present

## 2024-07-23 DIAGNOSIS — M810 Age-related osteoporosis without current pathological fracture: Secondary | ICD-10-CM | POA: Diagnosis not present

## 2024-07-23 DIAGNOSIS — Z780 Asymptomatic menopausal state: Secondary | ICD-10-CM | POA: Diagnosis not present

## 2024-07-23 DIAGNOSIS — Z13820 Encounter for screening for osteoporosis: Secondary | ICD-10-CM | POA: Diagnosis not present

## 2024-08-20 DIAGNOSIS — Z1329 Encounter for screening for other suspected endocrine disorder: Secondary | ICD-10-CM | POA: Diagnosis not present

## 2024-08-20 DIAGNOSIS — Z13220 Encounter for screening for lipoid disorders: Secondary | ICD-10-CM | POA: Diagnosis not present

## 2024-08-20 DIAGNOSIS — R944 Abnormal results of kidney function studies: Secondary | ICD-10-CM | POA: Diagnosis not present

## 2024-08-20 DIAGNOSIS — N1832 Chronic kidney disease, stage 3b: Secondary | ICD-10-CM | POA: Diagnosis not present

## 2024-08-20 DIAGNOSIS — R7303 Prediabetes: Secondary | ICD-10-CM | POA: Diagnosis not present

## 2024-08-20 DIAGNOSIS — D649 Anemia, unspecified: Secondary | ICD-10-CM | POA: Diagnosis not present

## 2024-08-20 DIAGNOSIS — I10 Essential (primary) hypertension: Secondary | ICD-10-CM | POA: Diagnosis not present

## 2024-08-26 DIAGNOSIS — E669 Obesity, unspecified: Secondary | ICD-10-CM | POA: Diagnosis not present

## 2024-08-26 DIAGNOSIS — Z23 Encounter for immunization: Secondary | ICD-10-CM | POA: Diagnosis not present

## 2024-08-26 DIAGNOSIS — I10 Essential (primary) hypertension: Secondary | ICD-10-CM | POA: Diagnosis not present

## 2024-08-26 DIAGNOSIS — R7303 Prediabetes: Secondary | ICD-10-CM | POA: Diagnosis not present

## 2024-08-26 DIAGNOSIS — I69354 Hemiplegia and hemiparesis following cerebral infarction affecting left non-dominant side: Secondary | ICD-10-CM | POA: Diagnosis not present

## 2024-08-26 DIAGNOSIS — Z6833 Body mass index (BMI) 33.0-33.9, adult: Secondary | ICD-10-CM | POA: Diagnosis not present

## 2024-09-04 DIAGNOSIS — M5416 Radiculopathy, lumbar region: Secondary | ICD-10-CM | POA: Diagnosis not present

## 2024-09-04 DIAGNOSIS — M25551 Pain in right hip: Secondary | ICD-10-CM | POA: Diagnosis not present
